# Patient Record
Sex: Male | Born: 1955
Health system: Southern US, Community
[De-identification: ages and names within clinical notes are randomized; demographics above are authoritative.]

## PROBLEM LIST (undated history)

## (undated) DIAGNOSIS — M1990 Unspecified osteoarthritis, unspecified site: Secondary | ICD-10-CM

## (undated) HISTORY — PX: CARPAL TUNNEL RELEASE: SHX101

## (undated) HISTORY — PX: GANGLION CYST EXCISION: SHX1691

---

## 2001-07-09 ENCOUNTER — Encounter: Payer: Self-pay | Admitting: Emergency Medicine

## 2001-07-09 ENCOUNTER — Emergency Department (HOSPITAL_COMMUNITY): Admission: EM | Admit: 2001-07-09 | Discharge: 2001-07-09 | Payer: Self-pay | Admitting: Emergency Medicine

## 2012-03-24 ENCOUNTER — Telehealth (HOSPITAL_COMMUNITY): Payer: Self-pay

## 2012-03-24 NOTE — Telephone Encounter (Signed)
Received a vm from Mercedes wanting to schedule pt for a lft arm angio/  She faxed the order.  It is in review with Dr. Oley Balm.

## 2012-03-25 ENCOUNTER — Telehealth (HOSPITAL_COMMUNITY): Payer: Self-pay

## 2012-03-25 ENCOUNTER — Other Ambulatory Visit (HOSPITAL_COMMUNITY): Payer: Self-pay | Admitting: Orthopedic Surgery

## 2012-03-25 DIAGNOSIS — Q279 Congenital malformation of peripheral vascular system, unspecified: Secondary | ICD-10-CM

## 2012-03-25 NOTE — Telephone Encounter (Signed)
I spoke with the pt's wife and Bonita Quin at [facility]

## 2012-03-27 ENCOUNTER — Other Ambulatory Visit: Payer: Self-pay | Admitting: Radiology

## 2012-03-31 ENCOUNTER — Other Ambulatory Visit: Payer: Self-pay | Admitting: Radiology

## 2012-04-02 ENCOUNTER — Encounter (HOSPITAL_COMMUNITY): Payer: Self-pay

## 2012-04-02 ENCOUNTER — Ambulatory Visit (HOSPITAL_COMMUNITY)
Admission: RE | Admit: 2012-04-02 | Discharge: 2012-04-02 | Disposition: A | Discharge status: Home / Self Care | Payer: Worker's Compensation | Source: Ambulatory Visit | Attending: Orthopedic Surgery | Admitting: Orthopedic Surgery

## 2012-04-02 ENCOUNTER — Other Ambulatory Visit (HOSPITAL_COMMUNITY): Payer: Self-pay | Admitting: Orthopedic Surgery

## 2012-04-02 DIAGNOSIS — M799 Soft tissue disorder, unspecified: Secondary | ICD-10-CM | POA: Insufficient documentation

## 2012-04-02 DIAGNOSIS — Q279 Congenital malformation of peripheral vascular system, unspecified: Secondary | ICD-10-CM

## 2012-04-02 LAB — BASIC METABOLIC PANEL
Calcium: 9 mg/dL (ref 8.4–10.5)
Chloride: 102 mEq/L (ref 96–112)
Creatinine, Ser: 0.75 mg/dL (ref 0.50–1.35)
GFR calc Af Amer: >90 mL/min (ref >90)
Sodium: 139 mEq/L (ref 135–145)

## 2012-04-02 LAB — DIFFERENTIAL
Basophils Absolute: 0 10*3/uL (ref 0.0–0.1)
Lymphocytes Relative: 32 % (ref 12–46)
Neutro Abs: 4 10*3/uL (ref 1.7–7.7)

## 2012-04-02 LAB — CBC
Platelets: 154 10*3/uL (ref 150–400)
RBC: 4.72 MIL/uL (ref 4.22–5.81)
RDW: 12.7 % (ref 11.5–15.5)
WBC: 7.3 10*3/uL (ref 4.0–10.5)

## 2012-04-02 LAB — PROTIME-INR
INR: 0.93 (ref 0.00–1.49)
Prothrombin Time: 12.7 seconds (ref 11.6–15.2)

## 2012-04-02 LAB — APTT: aPTT: 26 seconds (ref 24–37)

## 2012-04-02 MED ORDER — HYDROCODONE-ACETAMINOPHEN 5-325 MG PO TABS: 1.0000 | ORAL_TABLET | ORAL | Status: DC | PRN

## 2012-04-02 MED ORDER — MIDAZOLAM HCL 5 MG/5ML IJ SOLN
INTRAMUSCULAR | Status: AC | PRN
  Administered 2012-04-02: 2 mg via INTRAVENOUS
  Administered 2012-04-02: 0.5 mg via INTRAVENOUS
  Administered 2012-04-02: 1 mg via INTRAVENOUS

## 2012-04-02 MED ORDER — MIDAZOLAM HCL 2 MG/2ML IJ SOLN
INTRAMUSCULAR | Status: AC
  Filled 2012-04-02: qty 6

## 2012-04-02 MED ORDER — NITROGLYCERIN 1 MG/10 ML FOR IR/CATH LAB
INTRA_ARTERIAL | Status: AC
  Filled 2012-04-02: qty 10

## 2012-04-02 MED ORDER — FENTANYL CITRATE 0.05 MG/ML IJ SOLN
INTRAMUSCULAR | Status: AC
  Filled 2012-04-02: qty 4

## 2012-04-02 MED ORDER — SODIUM CHLORIDE 0.9 % IV SOLN
Freq: Once | INTRAVENOUS | Status: AC
  Administered 2012-04-02: 1000 mL via INTRAVENOUS

## 2012-04-02 MED ORDER — IODIXANOL 320 MG/ML IV SOLN
300.0000 mL | Freq: Once | INTRAVENOUS | Status: AC | PRN
  Administered 2012-04-02: 110 mL via INTRA_ARTERIAL

## 2012-04-02 MED ORDER — FENTANYL CITRATE 0.05 MG/ML IJ SOLN
INTRAMUSCULAR | Status: AC | PRN
  Administered 2012-04-02 (×2): 25 ug via INTRAVENOUS
  Administered 2012-04-02: 50 ug via INTRAVENOUS

## 2012-04-02 NOTE — H&P (Signed)
Shawn [name] is an 56 y.o. male.   Chief Complaint: Rt lateral epicondylitis x weeks; recent MRI shows mass effect at rt elbow- arteriovenous malformation. Scheduled now for arteriogram and possible intervention HPI: epicondylitis  No past medical history on file.  No past surgical history on file.  No family history on file. Social History:  reports that he has never smoked. He does not have any smokeless tobacco history on file. His alcohol and drug histories not on file.  Allergies: No Known Allergies   (Not in a hospital admission)  Results for orders placed during the hospital encounter of 04/02/12 (from the past 48 hour(s))  CBC     Status: Normal   Collection Time   04/02/12  7:15 AM      Component Value Range Comment   WBC 7.3  4.0 - 10.5 (K/uL)    RBC 4.72  4.22 - 5.81 (MIL/uL)    Hemoglobin 15.3  13.0 - 17.0 (g/dL)    HCT 16.1  09.6 - 04.5 (%)    MCV 90.3  78.0 - 100.0 (fL)    MCH 32.4  26.0 - 34.0 (pg)    MCHC 35.9  30.0 - 36.0 (g/dL)    RDW 40.9  81.1 - 91.4 (%)    Platelets 154  150 - 400 (K/uL)   DIFFERENTIAL     Status: Normal   Collection Time   04/02/12  7:15 AM      Component Value Range Comment   Neutrophils Relative 56  43 - 77 (%)    Neutro Abs 4.0  1.7 - 7.7 (K/uL)    Lymphocytes Relative 32  12 - 46 (%)    Lymphs Abs 2.3  0.7 - 4.0 (K/uL)    Monocytes Relative 9  3 - 12 (%)    Monocytes Absolute 0.6  0.1 - 1.0 (K/uL)    Eosinophils Relative 4  0 - 5 (%)    Eosinophils Absolute 0.3  0.0 - 0.7 (K/uL)    Basophils Relative 0  0 - 1 (%)    Basophils Absolute 0.0  0.0 - 0.1 (K/uL)    No results found.  Review of Systems  Constitutional: Negative for fever.  Eyes: Negative for blurred vision.  Respiratory: Negative for cough.   Cardiovascular: Negative for chest pain.  Gastrointestinal: Negative for nausea and vomiting.  Musculoskeletal: Negative for joint pain.  Neurological: Negative for dizziness and headaches.    Blood pressure 136/82,  pulse 79, temperature 97 F (36.1 C), temperature source Oral, resp. rate 20, height 5\' 6"  (1.676 m), weight 214 lb (97.07 kg), SpO2 97.00%. Physical Exam  Constitutional: He is oriented to person, place, and time. He appears well-developed and well-nourished.  Cardiovascular: Normal rate, regular rhythm and normal heart sounds.   No murmur heard. Respiratory: Effort normal and breath sounds normal. He has no wheezes.  GI: Soft. Bowel sounds are normal. There is no tenderness.  Musculoskeletal: Normal range of motion.       Palpable mass at Rt elbow; non pulsatile  Neurological: He is alert and oriented to person, place, and time.  Skin: Skin is warm and dry.  Psychiatric: He has a normal mood and affect. His behavior is normal. Judgment and thought content normal.     Assessment/Plan Rt lateral epicondylitis- mass like area at elbow; recent MRI shows possible AVM Now scheduled for arteriogram and possible intervention Pt aware of procedure benefits and risks and agreeable to proceed. Consent signed.  Matson Welch A  04/02/2012, 7:39 AM

## 2012-04-02 NOTE — Procedures (Signed)
Procedure:  Right upper extremity arteriography Findings:  Full report to be dictated.  Two areas of enhancing abnormality in proximal forearm.  No true AVM.   Closure w/ Cordis ExoSeal.  4 hour recovery.

## 2012-04-02 NOTE — H&P (Signed)
Agree 

## 2012-04-02 NOTE — Progress Notes (Signed)
Pt walked in hallway without difficulty.  Right groin dressing CDI. 

## 2012-04-02 NOTE — Discharge Instructions (Signed)

## 2012-04-02 NOTE — ED Notes (Signed)
R AIR SAT 97%

## 2012-04-02 NOTE — ED Notes (Signed)
Pedal pulses strong, tibial  pulse 2t

## 2012-04-22 ENCOUNTER — Other Ambulatory Visit: Payer: Self-pay | Admitting: Orthopedic Surgery

## 2012-05-04 ENCOUNTER — Encounter (HOSPITAL_COMMUNITY): Payer: Self-pay | Admitting: Pharmacy Technician

## 2012-05-11 ENCOUNTER — Encounter (HOSPITAL_COMMUNITY)
Admission: RE | Admit: 2012-05-11 | Discharge: 2012-05-11 | Disposition: A | Discharge status: Home / Self Care | Payer: Worker's Compensation | Source: Ambulatory Visit | Attending: Orthopedic Surgery | Admitting: Orthopedic Surgery

## 2012-05-11 ENCOUNTER — Encounter (HOSPITAL_COMMUNITY): Payer: Self-pay

## 2012-05-11 HISTORY — DX: Unspecified osteoarthritis, unspecified site: M19.90

## 2012-05-11 LAB — CBC
HCT: 42.6 % (ref 39.0–52.0)
Hemoglobin: 15.2 g/dL (ref 13.0–17.0)
MCH: 32.7 pg (ref 26.0–34.0)
MCHC: 35.7 g/dL (ref 30.0–36.0)
MCV: 91.6 fL (ref 78.0–100.0)
RBC: 4.65 MIL/uL (ref 4.22–5.81)

## 2012-05-11 NOTE — Pre-Procedure Instructions (Signed)
20 Shawn [name]  05/11/2012   Your procedure is scheduled on:  Fri, July 19 @ 7:30 AM  Report to [facility] at 5:30 AM.  Call this number if you have problems the morning of surgery: [phone]   Remember:   Do not eat food:After Midnight.       Do not wear jewelry  Do not wear lotions, powders, or cologne  Men may shave face and neck.  Do not bring valuables to the hospital.  Contacts, dentures or bridgework may not be worn into surgery.  Leave suitcase in the car. After surgery it may be brought to your room.  For patients admitted to the hospital, checkout time is 11:00 AM the day of discharge.   Patients discharged the day of surgery will not be allowed to drive home.    Special Instructions: CHG Shower Use Special Wash: 1/2 bottle night before surgery and 1/2 bottle morning of surgery.   Please read over the following fact sheets that you were given: Pain Booklet, Coughing and Deep Breathing, MRSA Information and Surgical Site Infection Prevention

## 2012-05-14 MED ORDER — CHLORHEXIDINE GLUCONATE 4 % EX LIQD: 60.0000 mL | Freq: Once | CUTANEOUS | Status: DC

## 2012-05-14 MED ORDER — CEFAZOLIN SODIUM-DEXTROSE 2-3 GM-% IV SOLR
2.0000 g | INTRAVENOUS | Status: AC
  Administered 2012-05-15: 2 g via INTRAVENOUS
  Filled 2012-05-14: qty 50

## 2012-05-14 MED ORDER — SODIUM CHLORIDE 0.45 % IV SOLN: INTRAVENOUS | Status: DC

## 2012-05-15 ENCOUNTER — Encounter (HOSPITAL_COMMUNITY): Payer: Self-pay | Admitting: Anesthesiology

## 2012-05-15 ENCOUNTER — Encounter (HOSPITAL_COMMUNITY)
Admission: RE | Disposition: A | Discharge status: Home / Self Care | Payer: Self-pay | Source: Ambulatory Visit | Attending: Orthopedic Surgery

## 2012-05-15 ENCOUNTER — Inpatient Hospital Stay (HOSPITAL_COMMUNITY)
Admission: RE | Admit: 2012-05-15 | Discharge: 2012-05-16 | DRG: 494 | Disposition: A | Discharge status: Home / Self Care | Payer: Worker's Compensation | Source: Ambulatory Visit | Attending: Orthopedic Surgery | Admitting: Orthopedic Surgery

## 2012-05-15 ENCOUNTER — Ambulatory Visit (HOSPITAL_COMMUNITY): Payer: Worker's Compensation | Admitting: Anesthesiology

## 2012-05-15 ENCOUNTER — Encounter (HOSPITAL_COMMUNITY): Payer: Self-pay | Admitting: General Practice

## 2012-05-15 DIAGNOSIS — R2230 Localized swelling, mass and lump, unspecified upper limb: Secondary | ICD-10-CM

## 2012-05-15 DIAGNOSIS — M7710 Lateral epicondylitis, unspecified elbow: Principal | ICD-10-CM | POA: Diagnosis present

## 2012-05-15 DIAGNOSIS — D2110 Benign neoplasm of connective and other soft tissue of unspecified upper limb, including shoulder: Secondary | ICD-10-CM | POA: Diagnosis present

## 2012-05-15 DIAGNOSIS — Z01818 Encounter for other preprocedural examination: Secondary | ICD-10-CM

## 2012-05-15 DIAGNOSIS — M6580 Other synovitis and tenosynovitis, unspecified site: Secondary | ICD-10-CM | POA: Diagnosis present

## 2012-05-15 DIAGNOSIS — M129 Arthropathy, unspecified: Secondary | ICD-10-CM | POA: Diagnosis present

## 2012-05-15 DIAGNOSIS — Z01812 Encounter for preprocedural laboratory examination: Secondary | ICD-10-CM

## 2012-05-15 DIAGNOSIS — M7711 Lateral epicondylitis, right elbow: Secondary | ICD-10-CM

## 2012-05-15 HISTORY — PX: ULNAR NERVE TRANSPOSITION: SHX2595

## 2012-05-15 HISTORY — PX: SYNOVECTOMY: SHX5180

## 2012-05-15 HISTORY — PX: MASS EXCISION: SHX2000

## 2012-05-15 SURGERY — EXCISION MASS
Anesthesia: General | Site: Elbow | Laterality: Right | Wound class: Clean

## 2012-05-15 MED ORDER — METHOCARBAMOL 500 MG PO TABS
500.0000 mg | ORAL_TABLET | Freq: Four times a day (QID) | ORAL | Status: DC | PRN
  Filled 2012-05-15: qty 1

## 2012-05-15 MED ORDER — FENTANYL CITRATE 0.05 MG/ML IJ SOLN
INTRAMUSCULAR | Status: DC | PRN
  Administered 2012-05-15 (×3): 25 ug via INTRAVENOUS
  Administered 2012-05-15: 100 ug via INTRAVENOUS

## 2012-05-15 MED ORDER — ONDANSETRON HCL 4 MG PO TABS: 4.0000 mg | ORAL_TABLET | Freq: Four times a day (QID) | ORAL | Status: DC | PRN

## 2012-05-15 MED ORDER — PROMETHAZINE HCL 25 MG RE SUPP: 12.5000 mg | Freq: Four times a day (QID) | RECTAL | Status: DC | PRN

## 2012-05-15 MED ORDER — 0.9 % SODIUM CHLORIDE (POUR BTL) OPTIME
TOPICAL | Status: DC | PRN
  Administered 2012-05-15 (×3): 1000 mL

## 2012-05-15 MED ORDER — ONDANSETRON HCL 4 MG/2ML IJ SOLN: 4.0000 mg | Freq: Four times a day (QID) | INTRAMUSCULAR | Status: DC | PRN

## 2012-05-15 MED ORDER — METHOCARBAMOL 500 MG PO TABS: 500.0000 mg | ORAL_TABLET | Freq: Four times a day (QID) | ORAL | Status: DC | PRN

## 2012-05-15 MED ORDER — ALPRAZOLAM 0.5 MG PO TABS: 0.5000 mg | ORAL_TABLET | Freq: Four times a day (QID) | ORAL | Status: DC | PRN

## 2012-05-15 MED ORDER — HYDROMORPHONE HCL PF 1 MG/ML IJ SOLN
0.2500 mg | INTRAMUSCULAR | Status: DC | PRN
  Administered 2012-05-15 (×4): 0.5 mg via INTRAVENOUS

## 2012-05-15 MED ORDER — LIDOCAINE HCL (CARDIAC) 20 MG/ML IV SOLN
INTRAVENOUS | Status: DC | PRN
  Administered 2012-05-15: 60 mg via INTRAVENOUS

## 2012-05-15 MED ORDER — ONDANSETRON HCL 4 MG/2ML IJ SOLN
INTRAMUSCULAR | Status: DC | PRN
  Administered 2012-05-15: 4 mg via INTRAVENOUS

## 2012-05-15 MED ORDER — HYDROMORPHONE HCL PF 1 MG/ML IJ SOLN
INTRAMUSCULAR | Status: AC
  Administered 2012-05-15: 0.5 mg via INTRAVENOUS
  Filled 2012-05-15: qty 1

## 2012-05-15 MED ORDER — DOCUSATE SODIUM 100 MG PO CAPS
100.0000 mg | ORAL_CAPSULE | Freq: Two times a day (BID) | ORAL | Status: DC
  Administered 2012-05-15 – 2012-05-16 (×2): 100 mg via ORAL
  Filled 2012-05-15 (×2): qty 1

## 2012-05-15 MED ORDER — PROPOFOL 10 MG/ML IV EMUL
INTRAVENOUS | Status: DC | PRN
  Administered 2012-05-15: 200 mg via INTRAVENOUS

## 2012-05-15 MED ORDER — LACTATED RINGERS IV SOLN
INTRAVENOUS | Status: DC
  Administered 2012-05-15: 1000 mL via INTRAVENOUS
  Administered 2012-05-16: 08:00:00 via INTRAVENOUS

## 2012-05-15 MED ORDER — FAMOTIDINE 20 MG PO TABS: 20.0000 mg | ORAL_TABLET | Freq: Two times a day (BID) | ORAL | Status: DC | PRN

## 2012-05-15 MED ORDER — ONDANSETRON HCL 4 MG/2ML IJ SOLN: 4.0000 mg | Freq: Once | INTRAMUSCULAR | Status: DC | PRN

## 2012-05-15 MED ORDER — FAMOTIDINE 20 MG PO TABS
20.0000 mg | ORAL_TABLET | Freq: Two times a day (BID) | ORAL | Status: DC | PRN
  Filled 2012-05-15: qty 1

## 2012-05-15 MED ORDER — METHOCARBAMOL 100 MG/ML IJ SOLN
500.0000 mg | Freq: Four times a day (QID) | INTRAVENOUS | Status: DC | PRN
  Administered 2012-05-15: 500 mg via INTRAVENOUS
  Filled 2012-05-15 (×3): qty 5

## 2012-05-15 MED ORDER — BUPIVACAINE HCL (PF) 0.25 % IJ SOLN
INTRAMUSCULAR | Status: AC
  Filled 2012-05-15: qty 30

## 2012-05-15 MED ORDER — OXYCODONE HCL 5 MG PO TABS: 5.0000 mg | ORAL_TABLET | ORAL | Status: DC | PRN

## 2012-05-15 MED ORDER — BUPIVACAINE HCL (PF) 0.25 % IJ SOLN
INTRAMUSCULAR | Status: DC | PRN
  Administered 2012-05-15: 10 mL

## 2012-05-15 MED ORDER — CEFAZOLIN SODIUM 1-5 GM-% IV SOLN
1.0000 g | INTRAVENOUS | Status: AC
  Administered 2012-05-15: 1 g via INTRAVENOUS
  Filled 2012-05-15: qty 50

## 2012-05-15 MED ORDER — VITAMIN C 500 MG PO TABS
1000.0000 mg | ORAL_TABLET | Freq: Every day | ORAL | Status: DC
  Administered 2012-05-16: 1000 mg via ORAL
  Filled 2012-05-15 (×2): qty 2

## 2012-05-15 MED ORDER — DOCUSATE SODIUM 100 MG PO CAPS: 100.0000 mg | ORAL_CAPSULE | Freq: Two times a day (BID) | ORAL | Status: DC

## 2012-05-15 MED ORDER — LACTATED RINGERS IV SOLN
INTRAVENOUS | Status: DC | PRN
  Administered 2012-05-15 (×2): via INTRAVENOUS

## 2012-05-15 MED ORDER — MORPHINE SULFATE 2 MG/ML IJ SOLN
1.0000 mg | INTRAMUSCULAR | Status: DC | PRN
  Administered 2012-05-15: 1 mg via INTRAVENOUS
  Filled 2012-05-15 (×2): qty 1

## 2012-05-15 MED ORDER — CHLORHEXIDINE GLUCONATE CLOTH 2 % EX PADS
6.0000 | MEDICATED_PAD | Freq: Every day | CUTANEOUS | Status: DC
  Administered 2012-05-15 – 2012-05-16 (×2): 6 via TOPICAL

## 2012-05-15 MED ORDER — CEFAZOLIN SODIUM 1-5 GM-% IV SOLN
1.0000 g | Freq: Three times a day (TID) | INTRAVENOUS | Status: DC
  Administered 2012-05-15 – 2012-05-16 (×2): 1 g via INTRAVENOUS
  Filled 2012-05-15 (×5): qty 50

## 2012-05-15 MED ORDER — MORPHINE SULFATE 2 MG/ML IJ SOLN
1.0000 mg | INTRAMUSCULAR | Status: DC | PRN
  Administered 2012-05-15: 1 mg via INTRAVENOUS

## 2012-05-15 MED ORDER — METHOCARBAMOL 100 MG/ML IJ SOLN: 500.0000 mg | Freq: Four times a day (QID) | INTRAVENOUS | Status: DC | PRN

## 2012-05-15 MED ORDER — MUPIROCIN 2 % EX OINT
1.0000 "application " | TOPICAL_OINTMENT | Freq: Two times a day (BID) | CUTANEOUS | Status: DC
  Administered 2012-05-15 – 2012-05-16 (×3): 1 via NASAL
  Filled 2012-05-15: qty 22

## 2012-05-15 MED ORDER — VITAMIN C 500 MG PO TABS: 1000.0000 mg | ORAL_TABLET | Freq: Every day | ORAL | Status: DC

## 2012-05-15 MED ORDER — OXYCODONE HCL 5 MG PO TABS
5.0000 mg | ORAL_TABLET | ORAL | Status: DC | PRN
  Administered 2012-05-15 – 2012-05-16 (×7): 10 mg via ORAL
  Filled 2012-05-15 (×7): qty 2

## 2012-05-15 SURGICAL SUPPLY — 66 items
BANDAGE ELASTIC 3 VELCRO ST LF (GAUZE/BANDAGES/DRESSINGS) ×5 IMPLANT
BANDAGE ELASTIC 4 VELCRO ST LF (GAUZE/BANDAGES/DRESSINGS) ×5 IMPLANT
BANDAGE GAUZE ELAST BULKY 4 IN (GAUZE/BANDAGES/DRESSINGS) ×5 IMPLANT
BNDG CMPR 9X4 STRL LF SNTH (GAUZE/BANDAGES/DRESSINGS)
BNDG COHESIVE 4X5 TAN STRL (GAUZE/BANDAGES/DRESSINGS) ×5 IMPLANT
BNDG ESMARK 4X9 LF (GAUZE/BANDAGES/DRESSINGS) ×3 IMPLANT
CLOTH BEACON ORANGE TIMEOUT ST (SAFETY) ×5 IMPLANT
CONT SPEC 4OZ CLIKSEAL STRL BL (MISCELLANEOUS) ×2 IMPLANT
CORDS BIPOLAR (ELECTRODE) ×5 IMPLANT
COVER MAYO STAND STRL (DRAPES) ×3 IMPLANT
COVER SURGICAL LIGHT HANDLE (MISCELLANEOUS) ×5 IMPLANT
CUFF TOURNIQUET SINGLE 18IN (TOURNIQUET CUFF) ×5 IMPLANT
CUFF TOURNIQUET SINGLE 24IN (TOURNIQUET CUFF) IMPLANT
DRAPE EXTREMITY T 121X128X90 (DRAPE) ×2 IMPLANT
DRAPE INCISE IOBAN 66X45 STRL (DRAPES) ×3 IMPLANT
DRAPE OEC MINIVIEW 54X84 (DRAPES) IMPLANT
DRAPE ORTHO SPLIT 77X108 STRL (DRAPES) ×10
DRAPE SURG 17X23 STRL (DRAPES) ×2 IMPLANT
DRAPE SURG ORHT 6 SPLT 77X108 (DRAPES) ×2 IMPLANT
DRSG ADAPTIC 3X8 NADH LF (GAUZE/BANDAGES/DRESSINGS) ×2 IMPLANT
GAUZE XEROFORM 1X8 LF (GAUZE/BANDAGES/DRESSINGS) ×3 IMPLANT
GAUZE XEROFORM 5X9 LF (GAUZE/BANDAGES/DRESSINGS) ×2 IMPLANT
GLOVE BIOGEL M STRL SZ7.5 (GLOVE) ×5 IMPLANT
GLOVE SS BIOGEL STRL SZ 8 (GLOVE) ×4 IMPLANT
GLOVE SUPERSENSE BIOGEL SZ 8 (GLOVE) ×1
GOWN PREVENTION PLUS XLARGE (GOWN DISPOSABLE) ×5 IMPLANT
GOWN STRL NON-REIN LRG LVL3 (GOWN DISPOSABLE) ×10 IMPLANT
GOWN STRL REIN XL XLG (GOWN DISPOSABLE) ×10 IMPLANT
KIT BASIN OR (CUSTOM PROCEDURE TRAY) ×5 IMPLANT
KIT ROOM TURNOVER OR (KITS) ×5 IMPLANT
LOOP VESSEL MAXI BLUE (MISCELLANEOUS) IMPLANT
MANIFOLD NEPTUNE II (INSTRUMENTS) ×5 IMPLANT
NDL HYPO 25GX1X1/2 BEV (NEEDLE) IMPLANT
NEEDLE HYPO 25GX1X1/2 BEV (NEEDLE) ×5 IMPLANT
NS IRRIG 1000ML POUR BTL (IV SOLUTION) ×5 IMPLANT
PACK ORTHO EXTREMITY (CUSTOM PROCEDURE TRAY) ×3 IMPLANT
PAD ARMBOARD 7.5X6 YLW CONV (MISCELLANEOUS) ×10 IMPLANT
PAD CAST 4YDX4 CTTN HI CHSV (CAST SUPPLIES) ×7 IMPLANT
PADDING CAST COTTON 4X4 STRL (CAST SUPPLIES) ×5
SOLUTION BETADINE 4OZ (MISCELLANEOUS) ×5 IMPLANT
SPECIMEN JAR SMALL (MISCELLANEOUS) ×3 IMPLANT
SPLINT FIBERGLASS 4X30 (CAST SUPPLIES) ×2 IMPLANT
SPLINT PLASTER CAST XFAST 5X30 (CAST SUPPLIES) ×1 IMPLANT
SPLINT PLASTER XFAST SET 5X30 (CAST SUPPLIES) ×1
SPONGE GAUZE 4X4 12PLY (GAUZE/BANDAGES/DRESSINGS) ×5 IMPLANT
SPONGE LAP 18X18 X RAY DECT (DISPOSABLE) ×2 IMPLANT
SPONGE LAP 4X18 X RAY DECT (DISPOSABLE) ×4 IMPLANT
SPONGE SCRUB IODOPHOR (GAUZE/BANDAGES/DRESSINGS) ×5 IMPLANT
STRIP CLOSURE SKIN 1/2X4 (GAUZE/BANDAGES/DRESSINGS) ×2 IMPLANT
SUCTION FRAZIER TIP 10 FR DISP (SUCTIONS) IMPLANT
SUT FIBERWIRE 2-0 18 17.9 3/8 (SUTURE) ×10
SUT MERSILENE 4 0 P 3 (SUTURE) IMPLANT
SUT PROLENE 3 0 PS 2 (SUTURE) ×6 IMPLANT
SUT PROLENE 4 0 PS 2 18 (SUTURE) IMPLANT
SUT PROLENE 5 0 PS 2 (SUTURE) ×2 IMPLANT
SUT PROLENE 7 0 P 1 (SUTURE) ×2 IMPLANT
SUT VIC AB 2-0 CT1 27 (SUTURE)
SUT VIC AB 2-0 CT1 TAPERPNT 27 (SUTURE) IMPLANT
SUT VICRYL 4-0 PS2 18IN ABS (SUTURE) ×4 IMPLANT
SUTURE FIBERWR 2-0 18 17.9 3/8 (SUTURE) ×2 IMPLANT
SYR CONTROL 10ML LL (SYRINGE) ×2 IMPLANT
TOWEL OR 17X24 6PK STRL BLUE (TOWEL DISPOSABLE) ×5 IMPLANT
TOWEL OR 17X26 10 PK STRL BLUE (TOWEL DISPOSABLE) ×10 IMPLANT
TUBE CONNECTING 12X1/4 (SUCTIONS) ×2 IMPLANT
UNDERPAD 30X30 INCONTINENT (UNDERPADS AND DIAPERS) ×5 IMPLANT
WATER STERILE IRR 1000ML POUR (IV SOLUTION) ×3 IMPLANT

## 2012-05-15 NOTE — Preoperative (Signed)
Beta Blockers   Reason not to administer Beta Blockers:Not Applicable. No home beta blockers 

## 2012-05-15 NOTE — Anesthesia Procedure Notes (Signed)
Procedure Name: LMA Insertion Date/Time: 05/15/2012 7:32 AM Performed by: Garen Lah Pre-anesthesia Checklist: Patient identified, Timeout performed, Emergency Drugs available, Suction available and Patient being monitored Patient Re-evaluated:Patient Re-evaluated prior to inductionOxygen Delivery Method: Circle system utilized Preoxygenation: Pre-oxygenation with 100% oxygen Intubation Type: IV induction LMA: LMA inserted LMA Size: 4.0 Number of attempts: 1 Placement Confirmation: positive ETCO2 and breath sounds checked- equal and bilateral Tube secured with: Tape Dental Injury: Teeth and Oropharynx as per pre-operative assessment

## 2012-05-15 NOTE — Anesthesia Preprocedure Evaluation (Addendum)
Anesthesia Evaluation  Patient identified by MRN, date of birth, ID band Patient awake    Reviewed: Allergy & Precautions, H&P , NPO status , Patient's Chart, lab work & pertinent test results, reviewed documented beta blocker date and time   Airway Mallampati: I TM Distance: >3 FB Neck ROM: full    Dental  (+) Teeth Intact and Dental Advisory Given   Pulmonary          Cardiovascular Rhythm:regular Rate:Normal     Neuro/Psych    GI/Hepatic   Endo/Other    Renal/GU      Musculoskeletal   Abdominal   Peds  Hematology   Anesthesia Other Findings   Reproductive/Obstetrics                          Anesthesia Physical Anesthesia Plan  ASA: I  Anesthesia Plan: General   Post-op Pain Management:    Induction: Intravenous  Airway Management Planned: LMA  Additional Equipment:   Intra-op Plan:   Post-operative Plan: Extubation in OR  Informed Consent: I have reviewed the patients History and Physical, chart, labs and discussed the procedure including the risks, benefits and alternatives for the proposed anesthesia with the patient or authorized representative who has indicated his/her understanding and acceptance.   Dental advisory given  Plan Discussed with: CRNA, Anesthesiologist and Surgeon  Anesthesia Plan Comments:        Anesthesia Quick Evaluation

## 2012-05-15 NOTE — Op Note (Signed)
See Dict # [id number] Amanda Pea MD

## 2012-05-15 NOTE — Anesthesia Postprocedure Evaluation (Signed)
  Anesthesia Post-op Note  Patient: Shawn [name]  Procedure(s) Performed: Procedure(s) (LRB): EXCISION MASS (Right) ULNAR NERVE DECOMPRESSION/TRANSPOSITION (Right) SYNOVECTOMY (Right)  Patient Location: PACU  Anesthesia Type: General  Level of Consciousness: awake, alert  and oriented  Airway and Oxygen Therapy: Patient Spontanous Breathing and Patient connected to nasal cannula oxygen  Post-op Pain: mild  Post-op Assessment: Post-op Vital signs reviewed, Patient's Cardiovascular Status Stable, Respiratory Function Stable, Patent Airway, No signs of Nausea or vomiting and Pain level controlled  Post-op Vital Signs: Reviewed and stable  Complications: No apparent anesthesia complications

## 2012-05-15 NOTE — Transfer of Care (Signed)
Immediate Anesthesia Transfer of Care Note  Patient: Shawn [name]  Procedure(s) Performed: Procedure(s) (LRB): EXCISION MASS (Right) ULNAR NERVE DECOMPRESSION/TRANSPOSITION (Right) SYNOVECTOMY (Right)  Patient Location: PACU  Anesthesia Type: General  Level of Consciousness: awake, alert  and oriented  Airway & Oxygen Therapy: Patient Spontanous Breathing and Patient connected to nasal cannula oxygen  Post-op Assessment: Report given to PACU RN and Post -op Vital signs reviewed and stable  Post vital signs: Reviewed and stable  Complications: No apparent anesthesia complications

## 2012-05-15 NOTE — H&P (Signed)
Shawn [name] is an 56 y.o. male.   Chief Complaint: Right arm pain HPI: Patient presents for right forearm mass removal as well as right lateral epicondylitis repair reconstruction is necessary.  Patient complains of pain about the lateral elbow which is been treated with conservative measures to no avail including injection pharmacologic therapy and other conservative measures. He understands the proposed op intervention. He is very large mass in his forearm which we are going to remove. Had studied this with angiography to rule out a large aneurysm. This appears to be either a feeding AV malformation or a cystic-type structure.  He denies neck back chest or abdominal pain .Marland KitchenPatient presents for evaluation and treatment of the of their upper extremity predicament. The patient denies neck back chest or of abdominal pain. The patient notes that they have no lower extremity problems. The patient from primarily complains of the upper extremity pain noted. Past Medical History  Diagnosis Date  . Arthritis     Past Surgical History  Procedure Date  . Carpal tunnel release     bil  . Ganglion cyst excision     No family history on file. Social History:  reports that he quit smoking about 22 years ago. He does not have any smokeless tobacco history on file. He reports that he does not drink alcohol or use illicit drugs.  Allergies: No Known Allergies  Medications Prior to Admission  Medication Sig Dispense Refill  . cholecalciferol (VITAMIN D) 1000 UNITS tablet Take 1,000 Units by mouth daily.      . Multiple Vitamin (MULITIVITAMIN WITH MINERALS) TABS Take 1 tablet by mouth daily.      . Saw Palmetto, Serenoa repens, (SAW PALMETTO PO) Take 1 tablet by mouth daily.      . traMADol (ULTRAM) 50 MG tablet Take 50 mg by mouth every 8 (eight) hours as needed. For pain        No results found for this or any previous visit (from the past 48 hour(s)). No results found.  Review of Systems    Constitutional: Negative.   HENT: Negative.   Eyes: Negative.   Respiratory: Negative.   Cardiovascular: Negative.   Gastrointestinal: Negative.   Genitourinary: Negative.   Skin: Negative.   Neurological: Negative.     Blood pressure 130/88, pulse 75, temperature 98 F (36.7 C), temperature source Oral, resp. rate 18, SpO2 97.00%. Physical Exam patient has a mass in his left arm palpable on the medial aspect. He is neurovascularly intact. Patient has chronic lateral mellitus about the right elbow. His painful and has failed conservative management. He has a normal pulse he has intact radial median and ulnar nerve function. Marland Kitchen.The patient is alert and oriented in no acute distress the patient complains of pain in the affected upper extremity. The patient is noted to have a normal HEENT exam. Lung fields show equal chest expansion and no shortness of breath abdomen exam is nontender without distention. Lower extremity examination does not show any fracture dislocation or blood clot symptoms. Pelvis is stable neck and back are stable and nontender  Assessment/Plan .Marland KitchenWe are planning surgery for your upper extremity. The risk and benefits of surgery include risk of bleeding infection anesthesia damage to normal structures and failure of the surgery to accomplish its intended goals of relieving symptoms and restoring function with this in mind we'll going to proceed. I have specifically discussed with the patient the pre-and postoperative regime and the does and don'ts and risk and benefits  in great detail. Risk and benefits of surgery also include risk of dystrophy chronic nerve pain failure of the healing process to go onto completion and other inherent risks of surgery The relavent the pathophysiology of the disease/injury process, as well as the alternatives for treatment and postoperative course of action has been discussed in great detail with the patient who desires to proceed.  We will do  everything in our power to help you (the patient) restore function to the upper extremity. Is a pleasure to see this patient today.   Karen Chafe 05/15/2012, 7:16 AM

## 2012-05-16 MED ORDER — METHOCARBAMOL 500 MG PO TABS: 500.0000 mg | ORAL_TABLET | Freq: Four times a day (QID) | ORAL | Status: AC | PRN

## 2012-05-16 MED ORDER — OXYCODONE HCL 5 MG PO TABS: 5.0000 mg | ORAL_TABLET | ORAL | Status: AC | PRN

## 2012-05-16 NOTE — Discharge Summary (Signed)
.. Physician Discharge Summary  Patient ID: Shawn [name] MRN: [id number] DOB/AGE: 1955-12-19 56 y.o.  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 The patient has a long history of chronic lateral epicondylitis, he is tried for conservative route treatment regime and persistent have significant pain and interference of daily activities. In addition the patient was noted to have findings on his MRI with a large forearm mass greater than 15 cm in nature and course of his workup for lateral epicondylitis. Given the large nature of the mass and pain associated with this, the decision was made to proceed with surgical excision as well as a concomitant ECRB debridement  The patient was admitted on 05/15/2012 and underwent the above surgical procedure without complications, please see operative report for full details. The patient was admitted to the orthopedic unit for observation, elevation of the upper extremity, IV pain medication and IV antibiotic administration he did very well he did have some difficulties with pain postoperative day #1 that the following day his pain was improved and was tolerable with the pain medications. The patient was tolerating a normal diet he was voiding without difficulty, and was noted to have flatus. Given his overall improved state decision was made to discharge him home.  Consults: Pathology   Treatments: Right elbow ECRB debridement, right forearm mass removal greater than 15 cm in nature,  please see operative report for full details  Discharge Exam: Blood pressure 133/68, pulse 98, temperature 98.9 F (37.2 C), temperature source Oral, resp. rate 20, height 5\' 6"  (1.676 m), weight 95.618 kg (210 lb 12.8 oz), SpO2 95.00%. Marland Kitchen.Patient presents for evaluation and treatment of the of their upper extremity predicament. The patient denies neck back chest or of abdominal pain. The patient notes that they have no lower extremity problems. The patient from primarily complains of the upper extremity pain noted. evaluation of the right upper extremity shows that his splint is clean and intact, he has excellent digital range of motion, no signs of infection are present. The drain is removed without difficulties. He has some mild numbness along the ulnar nerve distribution in the small and ring finger not unexpected.  Disposition: Final discharge disposition not confirmed  Discharge Orders    Future Orders Please Complete By Expires   Diet - low sodium heart healthy      Call MD / Call 911      Comments:   If you experience chest pain or shortness of breath, CALL 911 and be transported to the hospital emergency room.  If you develope a fever above 101 F, pus (white drainage) or increased drainage or redness at the wound, or calf pain, call your surgeon's office.   Constipation Prevention      Comments:   Drink plenty of fluids.  Prune juice may be helpful.  You may use a stool softener, such as Colace (over the counter) 100 mg twice a day.  Use MiraLax (over the counter) for constipation as needed.   Increase activity slowly as tolerated      OT splint / sling  05/16/13     Medication List  As of 05/16/2012  1:24 PM   TAKE these medications         cholecalciferol 1000 UNITS tablet   Commonly known as: VITAMIN D   Take 1,000 Units by mouth daily.      methocarbamol 500 MG tablet   Commonly known as: ROBAXIN   Take 1 tablet (500 mg total) by mouth every 6 (six) hours as needed.       multivitamin with minerals Tabs   Take 1 tablet by mouth daily.      oxyCODONE 5 MG immediate release tablet   Commonly known as: Oxy IR/ROXICODONE   Take 1 tablet (5 mg total) by mouth every 3 (three) hours as needed.      SAW PALMETTO PO   Take 1 tablet by mouth daily.      traMADol 50 MG tablet   Commonly known as: ULTRAM   Take 50 mg by mouth every 8 (eight) hours as needed. For pain           Follow-up Information    Follow up with Karen Chafe, MD. Schedule an appointment as soon as possible for a visit in 12 days. (please schedule appointment with therapy  to immediately follow M.D. appt.)    Contact information:   [address] [place] [address] [phone]          Signed: Sheran Lawless 05/16/2012, 1:24 PM

## 2012-05-16 NOTE — Progress Notes (Signed)
Occupational Therapy Evaluation Patient Details Name: Shawn [name] MRN: [id number] DOB: [birth year] Today's Date: 05/16/2012 Time: 4098-1191 OT Time Calculation (min): 24 min  OT Assessment / Plan / Recommendation Clinical Impression  56 yo s/p tumor excision and ulnar n decompression and neurolysis, ECRB ostectomy, arthotomy synovectomy R elbow. Completed all education regarding edema control, digit ROM and ADL. Pt to follow up with Dr. Butler Denmark and further therapy needs can be established at that time.    OT Assessment  All further OT needs can be met in the next venue of care    Follow Up Recommendations  Other (comment) (further outpt therapy as determined by MD)    Barriers to Discharge  none    Equipment Recommendations  None recommended by OT    Recommendations for Other Services  none  Frequency    eval only   Precautions / Restrictions Restrictions Weight Bearing Restrictions: No   Pertinent Vitals/Pain 7. nsg aware. Repositioned. Cold applied    ADL  Eating/Feeding: Simulated;Set up Grooming: Simulated;Minimal assistance Where Assessed - Grooming: Supported sitting Upper Body Bathing: Simulated;Moderate assistance Where Assessed - Upper Body Bathing: Supported sitting Lower Body Bathing: Simulated;Set up Where Assessed - Lower Body Bathing: Unsupported sit to stand Upper Body Dressing: Simulated;Moderate assistance Where Assessed - Upper Body Dressing: Unsupported sitting Lower Body Dressing: Simulated;Set up Where Assessed - Lower Body Dressing: Unsupported sit to stand Toilet Transfer: Performed;Modified independent Toilet Transfer Method: Sit to Barista: Comfort height toilet Toileting - Clothing Manipulation and Hygiene: Simulated;Modified independent Where Assessed - Toileting Clothing Manipulation and Hygiene: Standing Transfers/Ambulation Related to ADLs: Inidependent ADL Comments: Completed 1 handed ADL education    OT  Diagnosis: Generalized weakness;Acute pain  OT Problem List: Decreased strength;Decreased range of motion;Pain;Impaired UE functional use OT Treatment Interventions:     OT Goals Acute Rehab OT Goals OT Goal Formulation:  (eval only)  Visit Information  Last OT Received On: 05/16/12    Subjective Data      Prior Functioning  Vision/Perception  Home Living Lives With: Spouse Available Help at Discharge: Available PRN/intermittently Type of Home: House Home Access: Stairs to enter [employer] of Steps: 6 Home Layout: One level Firefighter: Standard Prior Function Level of Independence: Independent Able to Take Stairs?: Yes Driving: Yes Vocation: Full time employment Comments: Psychologist, educational: No difficulties Dominant Hand: Right      Cognition  Overall Cognitive Status: Appears within functional limits for tasks assessed/performed Arousal/Alertness: Awake/alert Orientation Level: Appears intact for tasks assessed Behavior During Session: [facility] for tasks performed    Extremity/Trunk Assessment Right Upper Extremity Assessment RUE ROM/Strength/Tone: Deficits;Due to pain;Due to precautions RUE Sensation: Deficits (ulnar n distribution - primarily little) RUE Coordination: Deficits Left Upper Extremity Assessment LUE ROM/Strength/Tone: WFL for tasks assessed Trunk Assessment Trunk Assessment: Normal   Mobility Bed Mobility Bed Mobility: Supine to Sit Details for Bed Mobility Assistance: independent Transfers Transfers: Sit to Stand;Stand to Sit Sit to Stand: 7: Independent   Exercise  digit ROM  Balance  WFL  End of Session OT - End of Session Activity Tolerance: Patient tolerated treatment well Patient left: in bed;with call bell/phone within reach;with family/visitor present Nurse Communication: Other (comment) (ulnar n sensory deficit)  GO     Raoul Ciano,HILLARY 05/16/2012, 10:52 AM Luisa Dago, OTR/L   [phone] 05/16/2012

## 2012-05-16 NOTE — Op Note (Signed)
[name], [name] NO.:  [id number]  MEDICAL RECORD NO.:  [id number]  LOCATION:  6N24C                        FACILITY:  [facility]  PHYSICIAN:  Dionne Ano. Ponce Skillman, M.D.DATE OF BIRTH:  [birth year]  DATE OF PROCEDURE: DATE OF DISCHARGE:                              OPERATIVE REPORT   PREOPERATIVE DIAGNOSES: 1. Large lobulated mass, right forearm with pain. 2. Chronic extensor carpi radialis brevis tendinosis, right elbow.  POSTOPERATIVE DIAGNOSES: 1. Large lobulated mass, right forearm with pain. 2. Chronic extensor carpi radialis brevis tendinosis, right elbow.  PROCEDURE: 1. Removal of a large greater than 15-cm mass, right forearm. 2. Ulnar nerve decompression and extensive neurolysis, right forearm     and elbow. 3. Extensor carpi radialis brevis debridement with partial ostectomy,     right elbow.  This was through a separate incision about the     lateral aspect of the elbow. 4. Arthrotomy synovectomy, limited in nature, right elbow.  SURGEON:  Dionne Ano. Amanda Pea, MD  ASSISTANT:  None.  COMPLICATIONS:  None.  ANESTHESIA:  General.  TOURNIQUET TIME:  An hour and 20 minutes.  There was a 55 minutes insufflation time followed by a deflation time, which was rather lengthy followed by a re-insufflation time for the second half of the procedure about the elbow.  DRAINS:  One.  INDICATIONS FOR THE PROCEDURE:  This patient is a pleasant male who presents with the above-mentioned diagnosis.  I have counseled him in regards to risks and benefits of surgery including risk of infection, bleeding, anesthesia, damage to normal structures, and failure of surgery to accomplish its intended goals of relieving symptoms and restoring function.  With this in mind, he desires to proceed.  All questions have been encouraged and answered preoperatively.  I have discussed with the patient that he has a very large mass, this is concerning.  I performed angiography to  make sure that this was not a vascular tumor and he did not appear to be.  I have examined him in great detail and at present time, plan to proceed with the above- mentioned operative intervention.  He understands the risks and benefits, do's and don'ts, etc.  With this in mind, we will proceed accordingly.  OPERATION DETAILS:  The patient was seen by myself and Anesthesia, taken to the operative suite, underwent a smooth induction of general anesthesia, laid spine, properly padded, prepped and draped in usual sterile fashion with Betadine scrub and paint about the right upper extremity.  Time-out was called.  Pre and postop checklist complete and following this, the arm was elevated.  Tourniquet was insufflated to 250 mmHg and a curvilinear incision was made over the medial aspect of the forearm.  This was a centimeter anterior to the subcutaneous border of the ulna on the medial side.  The patient had crossing veins cauterized. Following this, I very carefully and meticulously approached the mass. The mass was palpable and one could certainly feel the mass in the area in question.  I very carefully split the muscle fibers and encountered the mask.  It was a bilobed-type masses with significant investments in the musculature of the flexor pronator fascia of the forearm.  The patient had very significant investments in the muscle.  In this, I gave cause for concern in my opinion.  At this time, I enlarged the incision and identified the ulnar nerve as the mass was very close and in proximity to the ulnar nerve.  I identified the ulnar nerve proximally at the cubital tunnel.  I released the superficial and deep heads of the flexor carpi ulnaris, and then traced the nerve out distally.  This allowed me to trace the nerve and the mass together.  I split the muscle fibers carefully about the inferior half of the flexor carpi ulnaris. Following this, I was able to perform a distinct and  separate neurolysis and decompression of the ulnar nerve, swept it off of the area in question and I identified the mass.  The mass appeared to be in the muscle.  It was difficult to ascertain whether this was an old injury versus a process that arose de novo.  Nevertheless, the mass was circumferentially identified.  There were two bilobed components, which were removed as well as muscle fibers and muscle fibers with what appeared to be a heavy laden degree of fibrosis around them.  The mass was removed in its entirety.  Intraoperative pictures were taken as well as pictures with the mass removed.  I then sent this for frozen specimen.  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  At this point in time, I then irrigated it copiously as I did during multiple points during the procedure and following this, I made sure the nerve looked excellent which it did.  I then irrigated additionally, maintained excellent hemostatic control and ultimately, closed the wound over #7 TLS drain with 3-4 Vicryl sutures followed by 3-0 Prolene sutures used at the skin edge.  The compartments were soft.  I left a fascia released open and did not close the fascia.  Following this and with the frozen section confirming a benign condition, I then turned attention toward the lateral elbow where an incision was made.  A separate tourniquet was applied and the lateral elbow was approached through 1.5 to 2-inch incision, dissection was carried down and the patient then underwent ECRB debridement with partial ostectomy via creating an interval between the ECRL and the ECU, this was done without difficulty.  Debridement followed by partial ostectomy was accomplished.  Once this was done, I performed a limited synovectomy and arthrotomy of the elbow joint, which had noninfectious physiologic joint fluid noted.  I was pleased this in the findings. There were no  complicating issues and following this, I irrigated it copiously and closed the wound with pants-over-vest procedure with 2-0 FiberWire.  The healthy ECRL tissue was stepped against the ostectomy site and there were no complicating features.  The wound was closed with Vicryl followed by subcuticular Prolene and Steri-Strips.  The patient tolerated this well.  There were no complicating features.  All sponge, needle, and instrument counts were reported as correct.  Thus, the patient underwent a very large tumor mass removal as well as ulnar nerve decompression and neurolysis from the medial side.  This was an approximately 15-20 cm incision and was a very large mass with frozen section findings of a myxoma.  We will wait final pathologic diagnosis course.  Nevertheless, I was very pleased to see it was not wrapped around the nerve.  It did not invade the vascular main architecture and did look quite stable.  There  was one feeding branch off of the ulnar nerve, which communicated with the tumorous process and I de-clipped this.  However, the main portion of the ulnar nerve was not abnormal and appeared to be highly stable in my estimation.  At this time, as mentioned, I will wait pathologic confirmation for final path, but feel that this fits with intraoperative findings.  That this the presumed diagnosis of a myxoma and the intraoperative findings of a mass, which appeared to be in the muscular architecture despite one feeding branch from the ulnar nerve.  This certainly could represent a traumatic origin or other origin, it is difficult to say.  Nevertheless, I do feel that having this removed it going to be very helpful for Mr. Hreha.  We will rehab him accordingly to our ECRB debridement protocol, but a bit slower than usual given the large tumor removal.  Drain was hooked up to suction.  There were no complications.  All sponge, needle, and instrument counts were reported as  correct.  Long-arm splint was applied, and he was taken to the recovery room in stable condition.  I will keep him overnight for IV antibiotics and general pain control.     Dionne Ano. Amanda Pea, M.D.     [facility]  D:  05/15/2012  T:  05/16/2012  Job:  [id number]

## 2012-05-18 ENCOUNTER — Encounter (HOSPITAL_COMMUNITY): Payer: Self-pay | Admitting: Orthopedic Surgery

## 2013-06-11 IMAGING — US IR ANGIO/EXT/UNI*R*
1 series · 1 of 1 positions shown · non-contrast
Comparison: none

CLINICAL DATA: Soft tissue mass of the proximal right forearm.  MRI
evaluation has demonstrated increased vascularity and possible
vascular malformation.  Further characterization is performed by
arteriography.

[Series 1: ir angio/ext/uni*right* · 1 of 1 slices shown]
[im 1/1]
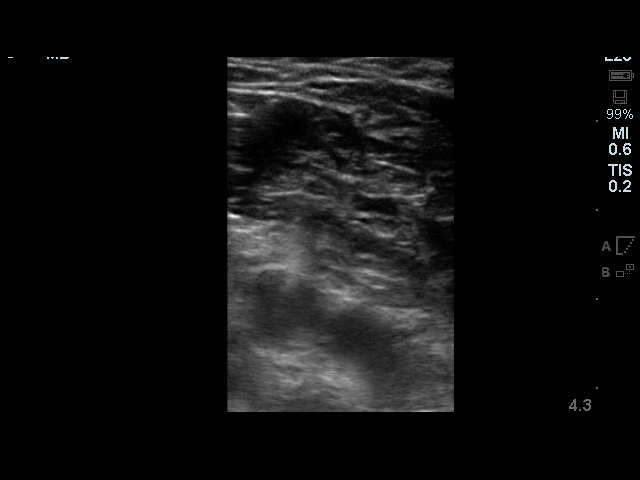

[1 of 1 positions shown; findings below may reference images not displayed]

1.  ULTRASOUND GUIDANCE FOR VASCULAR ACCESS OF THE RIGHT COMMON
FEMORAL ARTERY
2.  RIGHT UPPER EXTREMITY ARTERIOGRAPHY WITH CATHETERIZATION UP TO
THE LEVEL OF THE RIGHT BRACHIAL ARTERY.

Sedation: 3.5 mg IV Versed; 100 mcg IV Fentanyl.

Total Moderate Sedation Time: 45 minutes.

Contrast:  110 ml Visipaque 320

Additional Medications: 100 mcg intra-arterial nitroglycerin

Fluoroscopy Time: 5.7 minutes.

Procedure:  The procedure, risks, benefits, and alternatives were
explained to the patient.  Questions regarding the procedure were
encouraged and answered.  The patient understands and consents to
the procedure.

Both groin regions were prepped with Betadine in a sterile fashion,
and a sterile drape was applied covering the operative field.  A
sterile gown and sterile gloves were used for the procedure.  Local
anesthesia was provided with 1% Lidocaine.

Ultrasound guided access of the right common femoral artery was
performed utilizing a micropuncture set.  Ultrasound image
documentation was performed.  A guidewire was advanced to the level
of the iliac arteries.  A 5-French sheath was then advanced over
the wire.

A 5-French JB-1 catheter was advanced into the aorta.  This was
used to initially catheterize the innominate artery.  Selective
arteriography was performed.  The catheter was further advanced
into the subclavian artery and additional selective arteriography
performed.  The catheter was then advanced into the proximal
brachial artery and further selective arteriography performed with
full visualization of arterial supply to the level of the hand.
Arteriographic runs were carried through into the venous phase.
Oblique imaging was performed magnified over the proximal forearm
and included runs performed after intra-arterial administration of
nitroglycerin to promote vasodilatation.

After the procedure catheter and sheath were removed.  Hemostasis
was obtained utilizing the Cordis ExoSeal device and manual
compression.

Complications: None
FINDINGS: Proximal arterial supply of the right upper extremity is
normal including normally patent innominate artery, subclavian
artery and axillary artery.  The visualized proximal right common
carotid artery and right vertebral artery show normal patency.

The brachial artery is normally patent.  In the proximal forearm,
the ulnar artery is displaced and mildly tortuous at the level of
ovoid soft tissue enhancement corresponding to the region of the
patient's soft tissue mass.  Small branch vessels from the proximal
ulnar artery supply this region with a gradual blush noted at the
level of a dominant ovoid mass.  There also appears to be an
additional smaller area of blush in the proximal forearm just
proximal to the dominant area of abnormal vascularity.

The level of abnormal vascularity does not have the appearance of
an arteriovenous malformation, hemangioma or other type of venous
malformation.  No visualized early draining veins.  No evidence of
arterial pseudoaneurysm or AV fistula.  Arteriographic appearance
is consistent with some type of soft tissue mass.

The ulnar, interosseous and radial arteries show normal patency to
the level of the wrist.  Arteries of the hand show normal patency
with an incomplete arch present.  Normal venous drainage is seen
throughout the right upper extremity.
IMPRESSION: Abnormal vascularity at the level of the proximal forearm supplied
by branches of the ulnar artery.  Dominant area of ovoid
enhancement has the appearance of a soft tissue mass and it is
exerting some mass effect on the proximal ulnar artery.  There is
no evidence by arteriography of vascular malformation or
hemangioma.  There also is an area of smaller nodular enhancement
just proximal to the dominant area in the proximal forearm.

## 2013-11-30 ENCOUNTER — Other Ambulatory Visit: Payer: Self-pay | Admitting: Physician Assistant

## 2015-11-30 ENCOUNTER — Other Ambulatory Visit: Payer: Self-pay | Admitting: Otolaryngology

## 2015-11-30 DIAGNOSIS — H9190 Unspecified hearing loss, unspecified ear: Secondary | ICD-10-CM

## 2015-11-30 DIAGNOSIS — R42 Dizziness and giddiness: Secondary | ICD-10-CM

## 2015-12-12 ENCOUNTER — Ambulatory Visit
Admission: RE | Admit: 2015-12-12 | Discharge: 2015-12-12 | Disposition: A | Discharge status: Home / Self Care | Payer: BLUE CROSS/BLUE SHIELD | Source: Ambulatory Visit | Attending: Otolaryngology | Admitting: Otolaryngology

## 2015-12-12 DIAGNOSIS — R42 Dizziness and giddiness: Secondary | ICD-10-CM

## 2015-12-12 DIAGNOSIS — H9190 Unspecified hearing loss, unspecified ear: Secondary | ICD-10-CM

## 2015-12-12 MED ORDER — GADOBENATE DIMEGLUMINE 529 MG/ML IV SOLN
20.0000 mL | Freq: Once | INTRAVENOUS | Status: AC | PRN
  Administered 2015-12-12: 20 mL via INTRAVENOUS

## 2015-12-20 DIAGNOSIS — IMO0001 Reserved for inherently not codable concepts without codable children: Secondary | ICD-10-CM | POA: Insufficient documentation

## 2016-02-11 DIAGNOSIS — Z0000 Encounter for general adult medical examination without abnormal findings: Secondary | ICD-10-CM | POA: Insufficient documentation

## 2016-02-11 DIAGNOSIS — E349 Endocrine disorder, unspecified: Secondary | ICD-10-CM | POA: Insufficient documentation

## 2016-02-11 DIAGNOSIS — G4733 Obstructive sleep apnea (adult) (pediatric): Secondary | ICD-10-CM | POA: Insufficient documentation

## 2016-02-11 DIAGNOSIS — J309 Allergic rhinitis, unspecified: Secondary | ICD-10-CM | POA: Insufficient documentation

## 2016-02-11 DIAGNOSIS — K219 Gastro-esophageal reflux disease without esophagitis: Secondary | ICD-10-CM | POA: Insufficient documentation

## 2016-02-12 DIAGNOSIS — R739 Hyperglycemia, unspecified: Secondary | ICD-10-CM | POA: Insufficient documentation

## 2016-05-09 DIAGNOSIS — H9319 Tinnitus, unspecified ear: Secondary | ICD-10-CM | POA: Insufficient documentation

## 2017-02-19 IMAGING — MR MR HEAD WO/W CM
10 of 11 series · 31 of 48 positions shown · IV contrast (multihance)
Comparison: None.

CLINICAL DATA: Left-sided hearing loss for 2 years, progressively
worsening. Dizziness.

EXAM:
MRI HEAD WITHOUT AND WITH CONTRAST
TECHNIQUE: Multiplanar, multiecho pulse sequences of the brain and surrounding
structures were obtained without and with intravenous contrast.
CONTRAST:  20mL MULTIHANCE GADOBENATE DIMEGLUMINE 529 MG/ML IV SOLN

[Series 2: T1 · sagittal · 5.0mm · 0.45mm/px · 3 of 19 slices shown (1 of 4)]
[im 1/19]
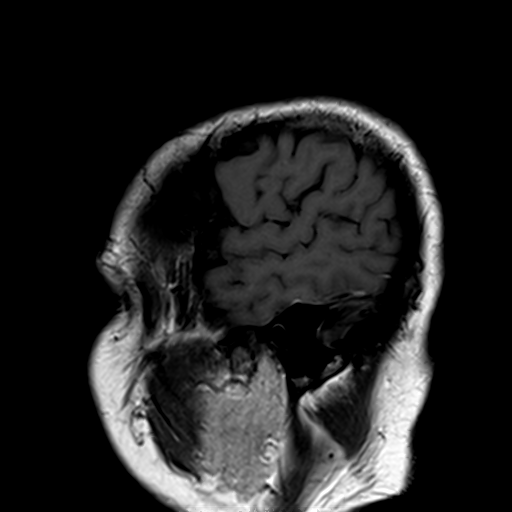
[im 10/19]
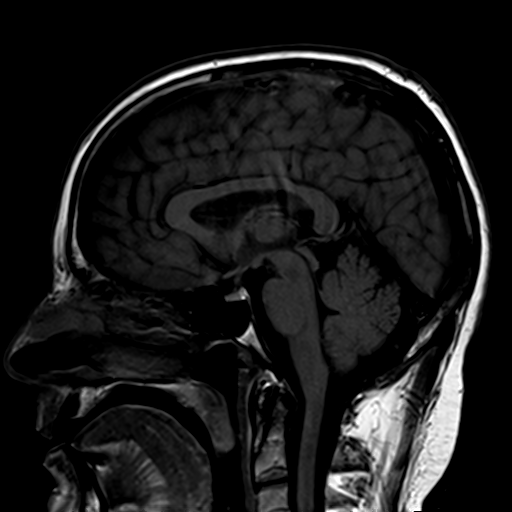
[im 19/19]
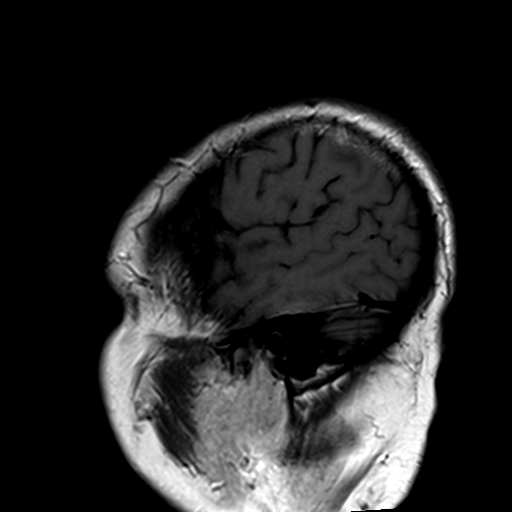

[Series 3: T2 · axial · 5.0mm · 0.45mm/px · z∈[-94,+43]mm · 4 of 23 slices shown]
[im 1/23]
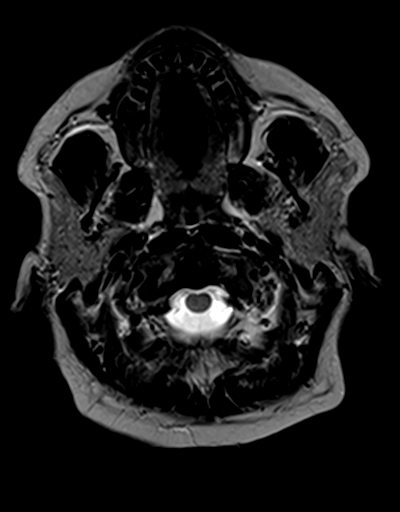
[im 8/23]
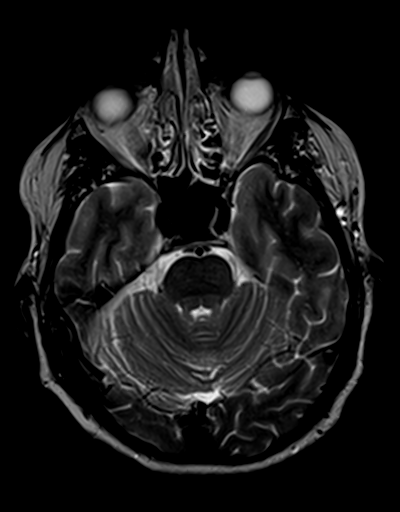
[im 15/23]
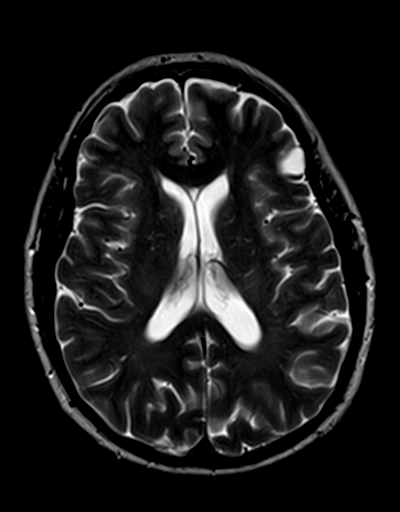
[im 23/23]
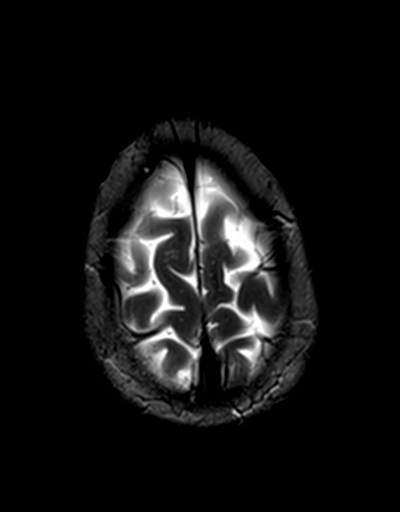

[Series 4: ep2d_diff_(id)_trace · axial · 3.0mm · 1.80mm/px · z∈[-100,+47]mm · 8 of 100 slices shown]
[im 1/100]
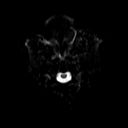
[im 17/100]
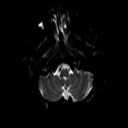
[im 34/100]
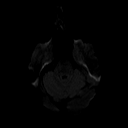
[im 42/100]
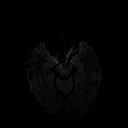
[im 58/100]
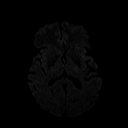
[im 67/100]
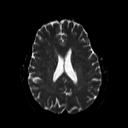
[im 83/100]
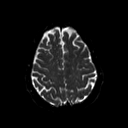
[im 100/100]
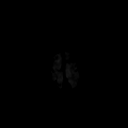

[Series 5: ep2d_diff_(id)_trace_adc · axial · 3.0mm · 1.80mm/px · z∈[-100,+47]mm · 6 of 48 slices shown]
[im 1/48]
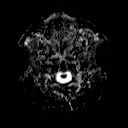
[im 10/48]
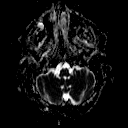
[im 19/48]
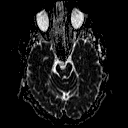
[im 29/48]
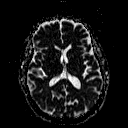
[im 38/48]
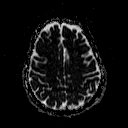
[im 48/48]
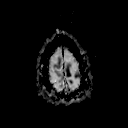

[Series 6: T1 · coronal · 3.0mm · 0.35mm/px · 1 of 11 slices shown (2 of 4)]
[im 1/11]
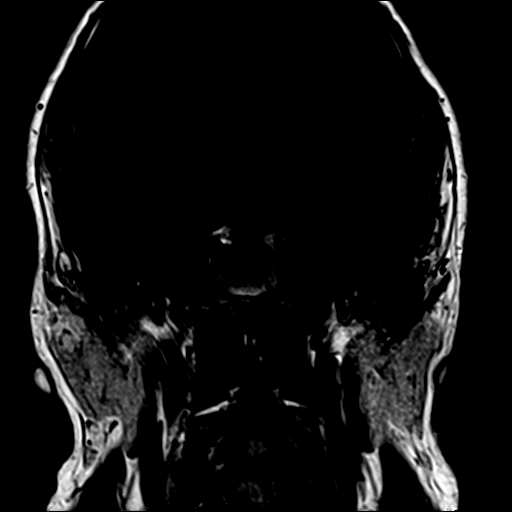

[Series 7: FLAIR · axial · 5.0mm · 0.45mm/px · z∈[-95,+42]mm · 3 of 23 slices shown]
[im 1/23]
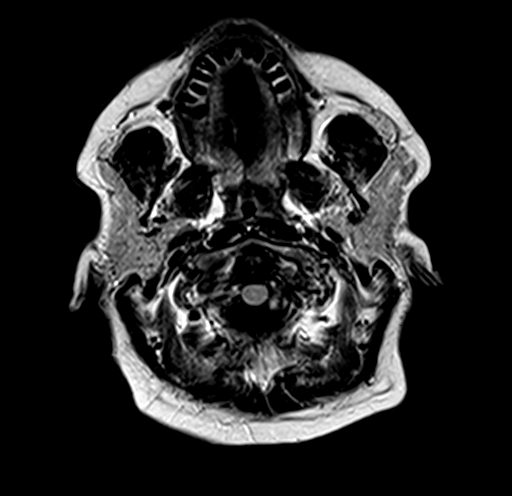
[im 12/23]
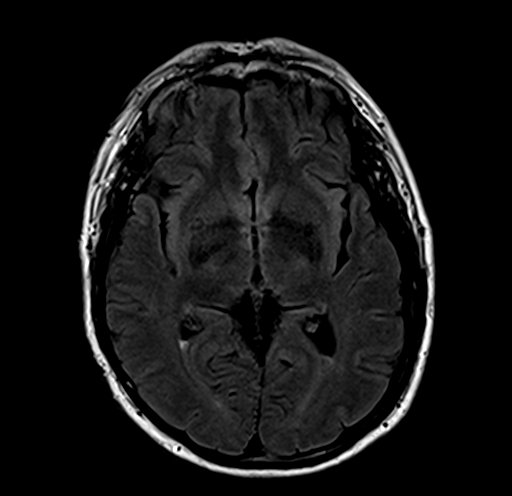
[im 23/23]
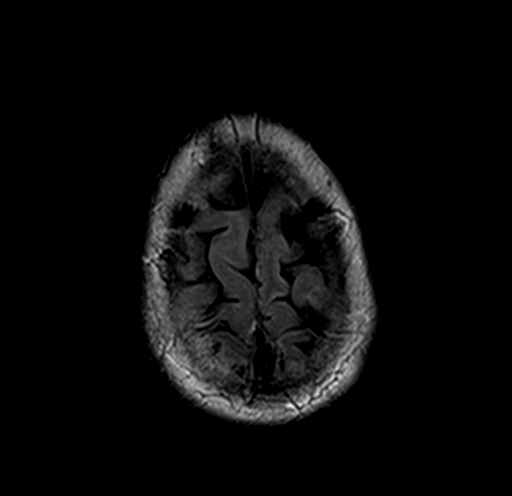

[Series 8: T1 · axial · 3.0mm · 0.35mm/px · 1 of 11 slices shown (3 of 4)]
[im 1/11]
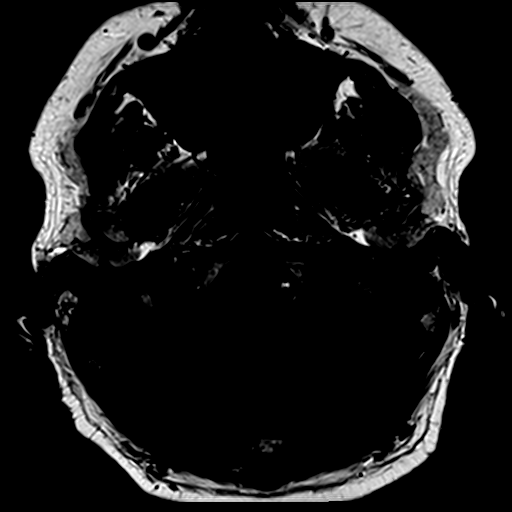

[Series 9: bSSFP · axial · 0.7mm · 0.28mm/px · z∈[-72,-60]mm · 3 of 44 slices shown]
[im 1/44]
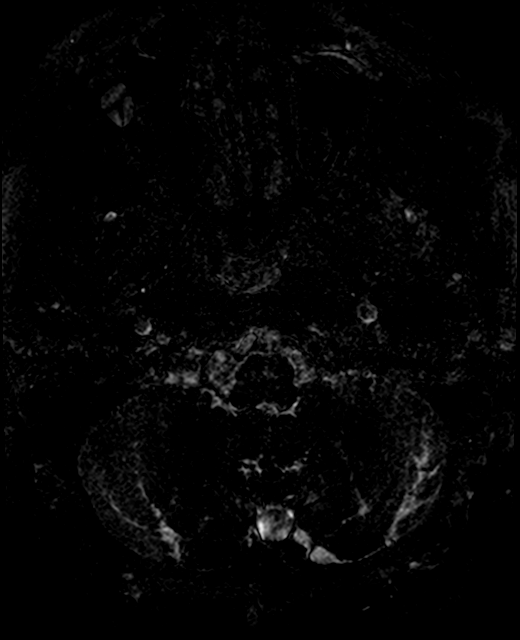
[im 9/44]
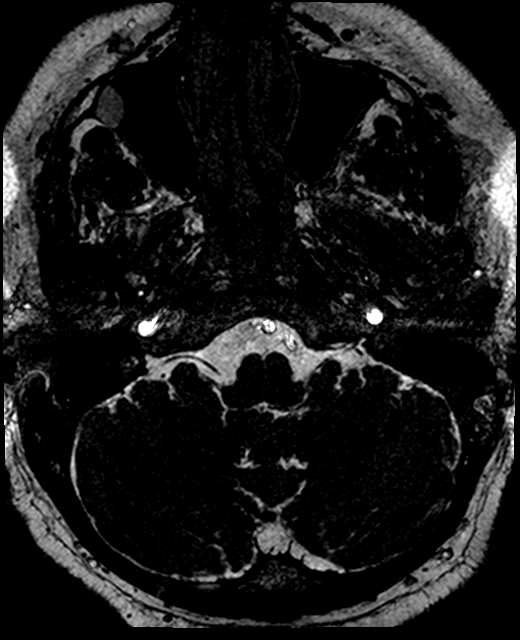
[im 18/44]
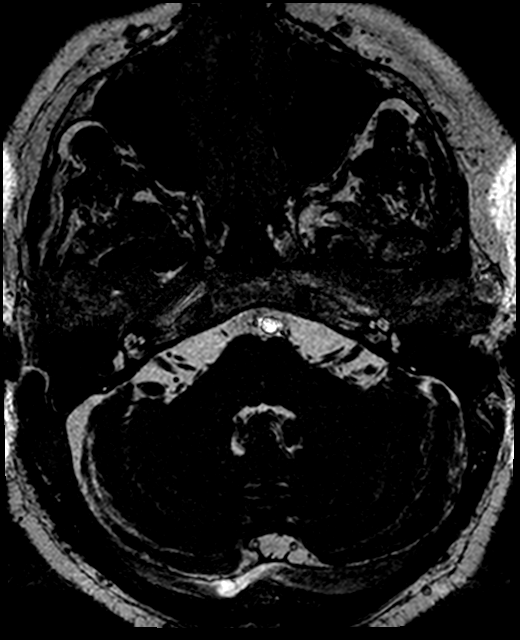

[Series 10: T1 · coronal · 3.0mm · 0.35mm/px · 1 of 11 slices shown (4 of 4)]
[im 1/11]
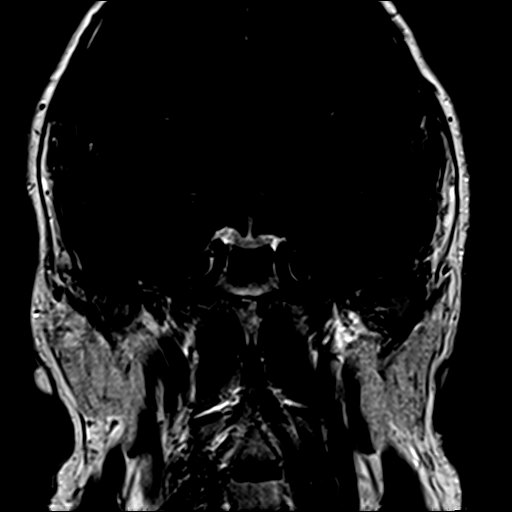

[Series 11: T1 post-contrast · axial · 3.0mm · 0.35mm/px · 1 of 11 slices shown]
[im 1/11]
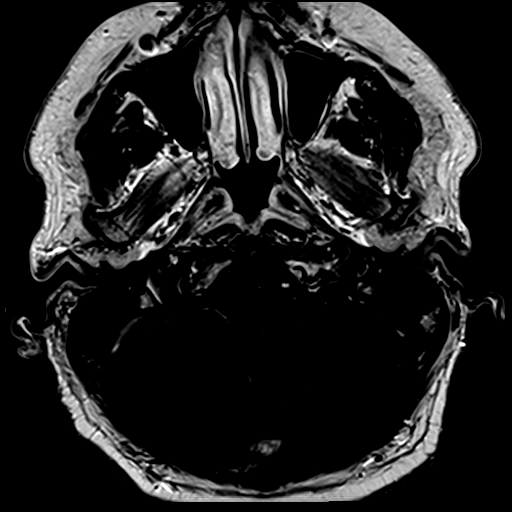

[31 of 48 positions shown; findings below may reference images not displayed]

FINDINGS: There is no evidence of acute infarct, intracranial hemorrhage,
midline shift, or extra-axial fluid collection. Ventricles and sulci
are within normal limits for age. Punctate, scattered foci of T2
hyperintensity are present in the periventricular and subcortical
cerebral white matter bilaterally, at most minimally advanced for
age.

There is a 13 mm T2 hyperintense lesion at the level of the left
frontal operculum which appears cystic, following CSF signal
intensity. It is not completely clear whether this lesion is located
in the cortex or whether it is extra-axial, with evaluation
partially hampered by limited whole brain imaging on this IAC
protocol examination. No lesional enhancement is seen, however there
is a thin rim of FLAIR hyperintensity along the medial and posterior
margins of the lesion.

The orbits are unremarkable. A small right maxillary mucous
retention cyst is noted. There is minimal bilateral ethmoid and left
maxillary sinus mucosal thickening. The mastoid air cells are clear.
Major intracranial vascular flow voids are preserved.

Dedicated imaging through the internal auditory canals demonstrates
a normal course of cranial nerves VII and VIII without evidence of
mass or abnormal enhancement. Inner ear structures demonstrate
normal signal bilaterally. No mass is seen within the
cerebellopontine angles.
IMPRESSION: 1. Unremarkable appearance of the internal auditory canals. No
evidence of vestibular schwannoma.
2. 13 mm nonenhancing left frontal operculum lesion. This may
represent a benign cyst, however given the thin rim of surrounding
gliosis or edema and the uncertainty as to its intra-axial or
extra-axial location on this somewhat limited study, a follow-up
routine brain MRI without and with IV contrast (which will include
whole brain coronal sequences) is recommended in 6 months to assess
its stability and help exclude neoplasm.
3. Minimal nonspecific cerebral white matter disease.

## 2018-09-08 DIAGNOSIS — Z6832 Body mass index (BMI) 32.0-32.9, adult: Secondary | ICD-10-CM | POA: Diagnosis not present

## 2018-09-08 DIAGNOSIS — G930 Cerebral cysts: Secondary | ICD-10-CM | POA: Diagnosis not present

## 2018-09-08 DIAGNOSIS — R42 Dizziness and giddiness: Secondary | ICD-10-CM | POA: Diagnosis not present

## 2018-09-10 DIAGNOSIS — H8190 Unspecified disorder of vestibular function, unspecified ear: Secondary | ICD-10-CM | POA: Diagnosis not present

## 2018-09-10 DIAGNOSIS — R42 Dizziness and giddiness: Secondary | ICD-10-CM | POA: Diagnosis not present

## 2018-09-10 DIAGNOSIS — R269 Unspecified abnormalities of gait and mobility: Secondary | ICD-10-CM | POA: Diagnosis not present

## 2018-09-16 DIAGNOSIS — R269 Unspecified abnormalities of gait and mobility: Secondary | ICD-10-CM | POA: Diagnosis not present

## 2018-09-16 DIAGNOSIS — H8190 Unspecified disorder of vestibular function, unspecified ear: Secondary | ICD-10-CM | POA: Diagnosis not present

## 2018-09-16 DIAGNOSIS — R42 Dizziness and giddiness: Secondary | ICD-10-CM | POA: Diagnosis not present

## 2018-09-21 DIAGNOSIS — H8190 Unspecified disorder of vestibular function, unspecified ear: Secondary | ICD-10-CM | POA: Diagnosis not present

## 2018-09-21 DIAGNOSIS — R269 Unspecified abnormalities of gait and mobility: Secondary | ICD-10-CM | POA: Diagnosis not present

## 2018-09-21 DIAGNOSIS — R42 Dizziness and giddiness: Secondary | ICD-10-CM | POA: Diagnosis not present

## 2018-09-28 DIAGNOSIS — H8190 Unspecified disorder of vestibular function, unspecified ear: Secondary | ICD-10-CM | POA: Diagnosis not present

## 2018-09-28 DIAGNOSIS — R42 Dizziness and giddiness: Secondary | ICD-10-CM | POA: Diagnosis not present

## 2018-09-28 DIAGNOSIS — R269 Unspecified abnormalities of gait and mobility: Secondary | ICD-10-CM | POA: Diagnosis not present

## 2018-10-06 DIAGNOSIS — H8190 Unspecified disorder of vestibular function, unspecified ear: Secondary | ICD-10-CM | POA: Diagnosis not present

## 2018-10-06 DIAGNOSIS — R42 Dizziness and giddiness: Secondary | ICD-10-CM | POA: Diagnosis not present

## 2018-10-06 DIAGNOSIS — R269 Unspecified abnormalities of gait and mobility: Secondary | ICD-10-CM | POA: Diagnosis not present

## 2018-10-29 DIAGNOSIS — R269 Unspecified abnormalities of gait and mobility: Secondary | ICD-10-CM | POA: Diagnosis not present

## 2018-10-29 DIAGNOSIS — H8190 Unspecified disorder of vestibular function, unspecified ear: Secondary | ICD-10-CM | POA: Diagnosis not present

## 2018-10-29 DIAGNOSIS — R42 Dizziness and giddiness: Secondary | ICD-10-CM | POA: Diagnosis not present

## 2018-11-09 DIAGNOSIS — H1132 Conjunctival hemorrhage, left eye: Secondary | ICD-10-CM | POA: Diagnosis not present

## 2018-11-16 DIAGNOSIS — Z125 Encounter for screening for malignant neoplasm of prostate: Secondary | ICD-10-CM | POA: Diagnosis not present

## 2018-11-16 DIAGNOSIS — Z131 Encounter for screening for diabetes mellitus: Secondary | ICD-10-CM | POA: Diagnosis not present

## 2018-11-16 DIAGNOSIS — Z13220 Encounter for screening for lipoid disorders: Secondary | ICD-10-CM | POA: Diagnosis not present

## 2018-11-16 DIAGNOSIS — Z1159 Encounter for screening for other viral diseases: Secondary | ICD-10-CM | POA: Diagnosis not present

## 2018-11-16 DIAGNOSIS — Z0000 Encounter for general adult medical examination without abnormal findings: Secondary | ICD-10-CM | POA: Diagnosis not present

## 2019-03-17 DIAGNOSIS — H9312 Tinnitus, left ear: Secondary | ICD-10-CM | POA: Diagnosis not present

## 2019-03-17 DIAGNOSIS — I739 Peripheral vascular disease, unspecified: Secondary | ICD-10-CM | POA: Diagnosis not present

## 2019-03-17 DIAGNOSIS — R42 Dizziness and giddiness: Secondary | ICD-10-CM | POA: Diagnosis not present

## 2019-03-17 DIAGNOSIS — G930 Cerebral cysts: Secondary | ICD-10-CM | POA: Diagnosis not present

## 2019-03-25 DIAGNOSIS — G930 Cerebral cysts: Secondary | ICD-10-CM | POA: Diagnosis not present

## 2019-03-25 DIAGNOSIS — H9392 Unspecified disorder of left ear: Secondary | ICD-10-CM | POA: Diagnosis not present

## 2019-03-25 DIAGNOSIS — R42 Dizziness and giddiness: Secondary | ICD-10-CM | POA: Diagnosis not present

## 2019-03-31 DIAGNOSIS — H9313 Tinnitus, bilateral: Secondary | ICD-10-CM | POA: Diagnosis not present

## 2019-03-31 DIAGNOSIS — I6523 Occlusion and stenosis of bilateral carotid arteries: Secondary | ICD-10-CM | POA: Diagnosis not present

## 2019-03-31 DIAGNOSIS — I739 Peripheral vascular disease, unspecified: Secondary | ICD-10-CM | POA: Diagnosis not present

## 2019-11-26 DIAGNOSIS — Z6833 Body mass index (BMI) 33.0-33.9, adult: Secondary | ICD-10-CM | POA: Diagnosis not present

## 2019-11-26 DIAGNOSIS — Z125 Encounter for screening for malignant neoplasm of prostate: Secondary | ICD-10-CM | POA: Diagnosis not present

## 2019-11-26 DIAGNOSIS — Z13220 Encounter for screening for lipoid disorders: Secondary | ICD-10-CM | POA: Diagnosis not present

## 2019-11-26 DIAGNOSIS — E669 Obesity, unspecified: Secondary | ICD-10-CM | POA: Diagnosis not present

## 2019-11-26 DIAGNOSIS — Z1331 Encounter for screening for depression: Secondary | ICD-10-CM | POA: Diagnosis not present

## 2019-11-26 DIAGNOSIS — Z0000 Encounter for general adult medical examination without abnormal findings: Secondary | ICD-10-CM | POA: Diagnosis not present

## 2019-12-08 DIAGNOSIS — K219 Gastro-esophageal reflux disease without esophagitis: Secondary | ICD-10-CM | POA: Diagnosis not present

## 2019-12-08 DIAGNOSIS — K4020 Bilateral inguinal hernia, without obstruction or gangrene, not specified as recurrent: Secondary | ICD-10-CM | POA: Diagnosis not present

## 2019-12-08 DIAGNOSIS — R1032 Left lower quadrant pain: Secondary | ICD-10-CM | POA: Diagnosis not present

## 2019-12-20 DIAGNOSIS — K4020 Bilateral inguinal hernia, without obstruction or gangrene, not specified as recurrent: Secondary | ICD-10-CM | POA: Diagnosis not present

## 2019-12-20 DIAGNOSIS — Z20822 Contact with and (suspected) exposure to covid-19: Secondary | ICD-10-CM | POA: Diagnosis not present

## 2019-12-20 DIAGNOSIS — Z01812 Encounter for preprocedural laboratory examination: Secondary | ICD-10-CM | POA: Diagnosis not present

## 2019-12-22 DIAGNOSIS — K4020 Bilateral inguinal hernia, without obstruction or gangrene, not specified as recurrent: Secondary | ICD-10-CM | POA: Diagnosis not present

## 2019-12-22 DIAGNOSIS — I252 Old myocardial infarction: Secondary | ICD-10-CM | POA: Diagnosis not present

## 2019-12-22 DIAGNOSIS — R9431 Abnormal electrocardiogram [ECG] [EKG]: Secondary | ICD-10-CM | POA: Diagnosis not present

## 2019-12-22 DIAGNOSIS — Z01810 Encounter for preprocedural cardiovascular examination: Secondary | ICD-10-CM | POA: Diagnosis not present

## 2019-12-27 DIAGNOSIS — K4020 Bilateral inguinal hernia, without obstruction or gangrene, not specified as recurrent: Secondary | ICD-10-CM | POA: Diagnosis not present

## 2019-12-27 DIAGNOSIS — K4030 Unilateral inguinal hernia, with obstruction, without gangrene, not specified as recurrent: Secondary | ICD-10-CM | POA: Diagnosis not present

## 2019-12-27 DIAGNOSIS — K4090 Unilateral inguinal hernia, without obstruction or gangrene, not specified as recurrent: Secondary | ICD-10-CM | POA: Diagnosis not present

## 2019-12-27 DIAGNOSIS — K4000 Bilateral inguinal hernia, with obstruction, without gangrene, not specified as recurrent: Secondary | ICD-10-CM | POA: Diagnosis not present

## 2020-07-18 DIAGNOSIS — R42 Dizziness and giddiness: Secondary | ICD-10-CM | POA: Insufficient documentation

## 2020-07-26 ENCOUNTER — Other Ambulatory Visit: Payer: Self-pay

## 2020-07-26 ENCOUNTER — Encounter: Payer: Self-pay | Admitting: Sports Medicine

## 2020-07-26 ENCOUNTER — Ambulatory Visit (INDEPENDENT_AMBULATORY_CARE_PROVIDER_SITE_OTHER): Payer: BC Managed Care – PPO | Admitting: Sports Medicine

## 2020-07-26 DIAGNOSIS — B351 Tinea unguium: Secondary | ICD-10-CM | POA: Diagnosis not present

## 2020-07-26 DIAGNOSIS — M79674 Pain in right toe(s): Secondary | ICD-10-CM

## 2020-07-26 NOTE — Progress Notes (Signed)
Subjective: Shawn [name] is a 64 y.o. male patient seen today in office with complaint of mildly painful thickened and discolored right great toenail.  Reports that he likely could have stubbed the toe and has noticed some blood to the area and thought that it would grow out and get better but then when it did not he started using an over-the-counter fungal nail medication for several weeks without any improvement.  Reports that the nail is becoming difficult to manage because of the thickness. Patient has no other pedal complaints at this time.  Review of systems noncontributory  Patient Active Problem List   Diagnosis Date Noted  . Dizziness 07/18/2020  . Tinnitus 05/09/2016  . Hyperglycemia 02/12/2016  . Allergic rhinitis 02/11/2016  . GERD (gastroesophageal reflux disease) 02/11/2016  . Hypotestosteronism 02/11/2016  . Obstructive sleep apnea 02/11/2016  . Wellness examination 02/11/2016  . Asymmetrical hearing loss of left ear 12/20/2015    No current outpatient medications on file prior to visit.   No current facility-administered medications on file prior to visit.    No Known Allergies  Objective: Physical Exam  General: Well developed, nourished, no acute distress, awake, alert and oriented x 3  Vascular: Dorsalis pedis artery 2/4 bilateral, Posterior tibial artery 2/4 bilateral, skin temperature warm to warm proximal to distal bilateral lower extremities, no varicosities, pedal hair present bilateral.  Neurological: Gross sensation present via light touch bilateral.   Dermatological: Skin is warm, dry, and supple bilateral, right hallux nail is tender, short thick, and discolored with mild subungal debris, proximal transverse trauma line, and there is also dried blood or a dark appearing pigment noted to the right lateral aspect of the hallux nail with no signs of infection, no webspace macerations present bilateral, no open lesions present bilateral, no  callus/corns/hyperkeratotic tissue present bilateral.  Musculoskeletal: No symptomatic boney deformities noted bilateral. Muscular strength within normal limits without painon range of motion. No pain with calf compression bilateral.  Assessment and Plan:  Problem List Items Addressed This Visit    None    Visit Diagnoses    Nail fungus    -  Primary   Toe pain, right          -Examined patient -Discussed treatment options for painful dystrophic right great toenail -Patient declined nail avulsion procedure at this time -Fungal culture was obtained by removing a portion of the hard nail itself from each of the involved toenail of the right great toe using a sterile nail nipper and sent to [facility]. Patient tolerated the biopsy procedure well without discomfort or need for anesthesia.  -Patient to return in 4 weeks for follow up evaluation and discussion of fungal culture results or sooner if symptoms worsen.  Landis Martins, DPM

## 2020-07-27 DIAGNOSIS — R42 Dizziness and giddiness: Secondary | ICD-10-CM | POA: Diagnosis not present

## 2020-07-27 DIAGNOSIS — Z0289 Encounter for other administrative examinations: Secondary | ICD-10-CM | POA: Diagnosis not present

## 2020-07-27 DIAGNOSIS — G4733 Obstructive sleep apnea (adult) (pediatric): Secondary | ICD-10-CM | POA: Diagnosis not present

## 2020-07-27 DIAGNOSIS — R9431 Abnormal electrocardiogram [ECG] [EKG]: Secondary | ICD-10-CM | POA: Diagnosis not present

## 2020-07-28 DIAGNOSIS — L608 Other nail disorders: Secondary | ICD-10-CM | POA: Diagnosis not present

## 2020-07-28 DIAGNOSIS — B351 Tinea unguium: Secondary | ICD-10-CM | POA: Diagnosis not present

## 2020-07-28 NOTE — Addendum Note (Signed)
Addended by: Cranford Mon R on: 07/28/2020 08:00 AM   Modules accepted: Orders

## 2020-08-09 DIAGNOSIS — R42 Dizziness and giddiness: Secondary | ICD-10-CM | POA: Diagnosis not present

## 2020-08-09 DIAGNOSIS — R9431 Abnormal electrocardiogram [ECG] [EKG]: Secondary | ICD-10-CM | POA: Diagnosis not present

## 2020-08-23 ENCOUNTER — Encounter: Payer: Self-pay | Admitting: Sports Medicine

## 2020-08-23 ENCOUNTER — Ambulatory Visit (INDEPENDENT_AMBULATORY_CARE_PROVIDER_SITE_OTHER): Payer: BC Managed Care – PPO | Admitting: Sports Medicine

## 2020-08-23 ENCOUNTER — Other Ambulatory Visit: Payer: Self-pay

## 2020-08-23 DIAGNOSIS — M722 Plantar fascial fibromatosis: Secondary | ICD-10-CM

## 2020-08-23 DIAGNOSIS — M79674 Pain in right toe(s): Secondary | ICD-10-CM

## 2020-08-23 DIAGNOSIS — L603 Nail dystrophy: Secondary | ICD-10-CM

## 2020-08-23 MED ORDER — TRIAMCINOLONE ACETONIDE 10 MG/ML IJ SUSP
10.0000 mg | Freq: Once | INTRAMUSCULAR | Status: AC
  Administered 2020-08-23: 10 mg

## 2020-08-23 NOTE — Progress Notes (Signed)
Subjective: Shawn [name] is a 64 y.o. male patient seen today in office for fungal culture results. 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 Patient has no other pedal complaints at this time.   Patient Active Problem List   Diagnosis Date Noted   Dizziness 07/18/2020   Tinnitus 05/09/2016   Hyperglycemia 02/12/2016   Allergic rhinitis 02/11/2016   GERD (gastroesophageal reflux disease) 02/11/2016   Hypotestosteronism 02/11/2016   Obstructive sleep apnea 02/11/2016   Wellness examination 02/11/2016   Asymmetrical hearing loss of left ear 12/20/2015    Current Outpatient Medications on File Prior to Visit  Medication Sig Dispense Refill   meclizine (ANTIVERT) 25 MG tablet Take 1 tablet by mouth 2 (two) times daily.     magnesium oxide (MAG-OX) 400 MG tablet Take by mouth.     No current facility-administered medications on file prior to visit.    No Known Allergies  Objective: Physical Exam  General: Well developed, nourished, no acute distress, awake, alert and oriented x 3  Vascular: Dorsalis pedis artery 2/4 bilateral, Posterior tibial artery 2/4 bilateral, skin temperature warm to warm proximal to distal bilateral lower extremities, no varicosities, pedal hair present bilateral.  Neurological: Gross sensation present via light touch bilateral.   Dermatological: Skin is warm, dry, and supple bilateral, right hallux, short thick, and discolored with mild subungal heme and trauma line, no webspace macerations present bilateral, no open lesions present bilateral, no callus/corns/hyperkeratotic tissue present bilateral. No signs of infection bilateral.  Musculoskeletal: + pain to insertion at right and left heel consistent with plantar fasciitis. Muscular strength within normal limits without painon range of motion. No pain with calf compression  bilateral.  Fungal culture negative suggestive of microtrauma  Assessment and Plan:  Problem List Items Addressed This Visit    None    Visit Diagnoses    Nail dystrophy    -  Primary   Toe pain, right       Plantar fasciitis, bilateral       Relevant Medications   triamcinolone acetonide (KENALOG) 10 MG/ML injection 10 mg (Completed) (Start on 08/23/2020  9:00 PM)      -Examined patient -Discussed treatment options for non-mycotic nail -Advised patient that nail changes at right hallux will grow out -May try tea tree oil or ACV -Discussed treatment for plantar fasciitis  -After oral consent and aseptic prep, injected a mixture containing 1 ml of 2%  plain lidocaine, 1 ml 0.5% plain marcaine, 0.5 ml of kenalog 10 and 0.5 ml of dexamethasone phosphate into right and left heel without complication. Post-injection care discussed with patient.  -Recommend stretching, good supportive shoes, icing and heel lifts as instructed -Patient to return if fails to improve or sooner if symptoms worsen.  Landis Martins, DPM

## 2020-09-01 DIAGNOSIS — R42 Dizziness and giddiness: Secondary | ICD-10-CM | POA: Diagnosis not present

## 2020-10-05 DIAGNOSIS — R079 Chest pain, unspecified: Secondary | ICD-10-CM | POA: Diagnosis not present

## 2020-10-05 DIAGNOSIS — R509 Fever, unspecified: Secondary | ICD-10-CM | POA: Diagnosis not present

## 2020-10-05 DIAGNOSIS — J989 Respiratory disorder, unspecified: Secondary | ICD-10-CM | POA: Diagnosis not present

## 2020-10-05 DIAGNOSIS — K219 Gastro-esophageal reflux disease without esophagitis: Secondary | ICD-10-CM | POA: Diagnosis not present

## 2020-10-06 ENCOUNTER — Other Ambulatory Visit: Payer: Self-pay | Admitting: Family

## 2020-10-06 ENCOUNTER — Telehealth: Payer: Self-pay | Admitting: Family

## 2020-10-06 DIAGNOSIS — G4733 Obstructive sleep apnea (adult) (pediatric): Secondary | ICD-10-CM

## 2020-10-06 DIAGNOSIS — U071 COVID-19: Secondary | ICD-10-CM

## 2020-10-06 DIAGNOSIS — Z609 Problem related to social environment, unspecified: Secondary | ICD-10-CM

## 2020-10-06 NOTE — Progress Notes (Signed)
I connected by phone with Shawn [name] on 10/06/2020 at 3:57 PM to discuss the potential use of a new treatment for mild to moderate COVID-19 viral infection in non-hospitalized patients.  This patient is a 64 y.o. male that meets the FDA criteria for Emergency Use Authorization of COVID monoclonal antibody casirivimab/imdevimab, bamlanivimab/eteseviamb, or sotrovimab.  Has a (+) direct SARS-CoV-2 viral test result  Has mild or moderate COVID-19   Is NOT hospitalized due to COVID-19  Is within 10 days of symptom onset  Has at least one of the high risk factor(s) for progression to severe COVID-19 and/or hospitalization as defined in EUA.  Specific high risk criteria : BMI > 25, Chronic Lung Disease and Other high risk medical condition per CDC:  High SVI score   I have spoken and communicated the following to the patient or parent/caregiver regarding COVID monoclonal antibody treatment:  1. FDA has authorized the emergency use for the treatment of mild to moderate COVID-19 in adults and pediatric patients with positive results of direct SARS-CoV-2 viral testing who are 80 years of age and older weighing at least 40 kg, and who are at high risk for progressing to severe COVID-19 and/or hospitalization.  2. The significant known and potential risks and benefits of COVID monoclonal antibody, and the extent to which such potential risks and benefits are unknown.  3. Information on available alternative treatments and the risks and benefits of those alternatives, including clinical trials.  4. Patients treated with COVID monoclonal antibody should continue to self-isolate and use infection control measures (e.g., wear mask, isolate, social distance, avoid sharing personal items, clean and disinfect "high touch" surfaces, and frequent handwashing) according to CDC guidelines.   5. The patient or parent/caregiver has the option to accept or refuse COVID monoclonal antibody treatment.  After  reviewing this information with the patient, the patient has agreed to receive one of the available covid 19 monoclonal antibodies and will be provided an appropriate fact sheet prior to infusion.   Mauricio Po, FNP 10/06/2020 3:57 PM

## 2020-10-06 NOTE — Telephone Encounter (Signed)
Called to Discuss with patient about Covid symptoms and the use of the monoclonal antibody infusion for those with mild to moderate Covid symptoms and at a high risk of hospitalization.     Pt appears to qualify for this infusion due to co-morbid conditions and/or a member of an at-risk group in accordance with the FDA Emergency Use Authorization.   Mr. Catalfamo tested positive for COVID-19 on 10/05/20. Symptoms started on 10/04/20 with chills and continues to experience fever, chills, body aches, fatigue, congestion and cough. Not vaccinated. Qualifying risk factors include BMI >25 (34), and obstructive sleep apnea.   I spoke with Mr. Scow regarding the risks, benefits, potential financial costs associated with treatment and he wishes to continue with treatment.     Hello Leevi L Reisig,   We have contacted you because you have recently been diagnosed with COVID-19 with active symptoms of less than 10 days in duration and may benefit from a new treatment for mild to moderate disease. This treatment has been shown to reduce the risk of hospitalization and decrease the risk for severe symptoms related to COVID-19 in a select group of patients with risk factors / medical conditions we believe put people at a higher risk for this infection.    The Food and Drug Administration (FDA) has given emergency use of a new medication to treat those with mild to moderate symptoms who have risk factors that have been identified for severe symptoms related to COVID-19. This new treatment is a monoclonal antibody - the way it works its that it attaches like a magnet to the SARS-CoV2 virus (the virus that causes COVID-19) and stops it from infecting more cells in your body. It does not kill the virus but it prevents it from spreading throughout your body with the hope that it will decrease your symptoms after it is given.    This new medication is an intravenous (IV) infusion that is given over one half hour in our  outpatient infusion clinic here in [place]. We will require you to stay roughly 60 minutes after the infusion to ensure you are tolerating it and watch for any allergic reaction to the medication. More information will also be given to you at the time of your appointment. There are three  different medications. The medications are called Bamlanivimab & Etesevimab (by Waynetta Sandy) or [place] (by [employer]) or Sotrovimab.   The side effects that have been seen with this treatment:  2-4% of recipients experience nausea, vomiting, diarrhea, dizziness, headaches, itching, worsening fevers or chills for ~24 hours.  There have been no serious infusion related reactions  Of the over 3000 patients who received the infusion one did have an allergic response that resolved once the infusion was stopped - this is why we monitor all of our patients closely for an hour after the infusion.   The covid 19 vaccine (including boosters) must be delayed at least 90 days after receiving this infusion.   The medication itself is free, but your insurance will be charged an infusion fee. The amount you may owe later varies from insurance to insurance. If you do not have insurance, we can put you in touch with our billing department. The CMS codes are:  [phone] or [phone] or [phone]   You have been scheduled to receive the monoclonal antibody therapy at [place]: 10/09/20 at 10:30 am   If you have been tested outside of a [facility] - you MUST bring a copy  of your positive test with you the morning of your appointment. You may take a photo of this and upload to your MyChart portal or have the testing facility fax the result to [phone]    The address for the infusion clinic site is:  --GPS address is [place] - the parking is located near [employer] building where you will see  COVID19 Infusion feather banner marking the entrance to parking.   (see photos below)             --Enter into the 2nd entrance where the "wave, flag banner" is at the road. Turn into this 2nd entrance and immediately turn left to park in 1 of the 5 parking spots.   --Please stay in your car and call the desk for assistance inside [phone].   --Average time in department is roughly 90 minutes to 2 hours for monoclonal treatment - this includes preparation of the medication, IV start and the required 1 hour monitoring after the infusion.    Should you develop worsening shortness of breath, chest pain or severe breathing problems please do not wait for this appointment and go to the Emergency room for evaluation and treatment. You will undergo another oxygen screen before your infusion to ensure this is the best treatment option for you. There is a chance that the best decision may be to send you to the Emergency Room for evaluation at the time of your appointment.   The day of your visit you should: Marland Kitchen Get plenty of rest the night before and drink plenty of water . Eat a light meal/snack before coming and take your medications as prescribed  . Wear warm, comfortable clothes with a shirt that can roll-up over the elbow (will need IV start).  . Wear a mask  . Consider bringing some activity to help pass the time   The medication itself is free, paid for by government COVID funding relief, and many commercial insurers are waiving bills related to COVID treatment, however some have sent bills to patients later for the administration fees related to the medication.  Please contact your insurance agent to discuss prior to your appointment if you would like further details about billing specific to your policy.    Billing codes are: G2563 or M0245 or [place], NP 10/06/2020 3:56 PM

## 2020-10-09 ENCOUNTER — Ambulatory Visit (HOSPITAL_COMMUNITY)
Admission: RE | Admit: 2020-10-09 | Discharge: 2020-10-09 | Disposition: A | Discharge status: Home / Self Care | Payer: BC Managed Care – PPO | Source: Ambulatory Visit | Attending: Pulmonary Disease | Admitting: Pulmonary Disease

## 2020-10-09 ENCOUNTER — Other Ambulatory Visit (HOSPITAL_COMMUNITY): Payer: Self-pay

## 2020-10-09 DIAGNOSIS — G4733 Obstructive sleep apnea (adult) (pediatric): Secondary | ICD-10-CM | POA: Diagnosis not present

## 2020-10-09 DIAGNOSIS — U071 COVID-19: Secondary | ICD-10-CM | POA: Diagnosis not present

## 2020-10-09 DIAGNOSIS — Z609 Problem related to social environment, unspecified: Secondary | ICD-10-CM | POA: Diagnosis not present

## 2020-10-09 MED ORDER — ALBUTEROL SULFATE HFA 108 (90 BASE) MCG/ACT IN AERS: 2.0000 | INHALATION_SPRAY | Freq: Once | RESPIRATORY_TRACT | Status: DC | PRN

## 2020-10-09 MED ORDER — DIPHENHYDRAMINE HCL 50 MG/ML IJ SOLN: 50.0000 mg | Freq: Once | INTRAMUSCULAR | Status: DC | PRN

## 2020-10-09 MED ORDER — EPINEPHRINE 0.3 MG/0.3ML IJ SOAJ: 0.3000 mg | Freq: Once | INTRAMUSCULAR | Status: DC | PRN

## 2020-10-09 MED ORDER — METHYLPREDNISOLONE SODIUM SUCC 125 MG IJ SOLR: 125.0000 mg | Freq: Once | INTRAMUSCULAR | Status: DC | PRN

## 2020-10-09 MED ORDER — SODIUM CHLORIDE 0.9 % IV SOLN: Freq: Once | INTRAVENOUS | Status: AC

## 2020-10-09 MED ORDER — FAMOTIDINE IN NACL 20-0.9 MG/50ML-% IV SOLN: 20.0000 mg | Freq: Once | INTRAVENOUS | Status: DC | PRN

## 2020-10-09 MED ORDER — SODIUM CHLORIDE 0.9 % IV SOLN: INTRAVENOUS | Status: DC | PRN

## 2020-10-09 NOTE — Discharge Instructions (Signed)
10 Things You Can Do to Manage Your COVID-19 Symptoms at Home If you have possible or confirmed COVID-19: 1. Stay home from work and school. And stay away from other public places. If you must go out, avoid using any kind of public transportation, ridesharing, or taxis. 2. Monitor your symptoms carefully. If your symptoms get worse, call your healthcare provider immediately. 3. Get rest and stay hydrated. 4. If you have a medical appointment, call the healthcare provider ahead of time and tell them that you have or may have COVID-19. 5. For medical emergencies, call 911 and notify the dispatch personnel that you have or may have COVID-19. 6. Cover your cough and sneezes with a tissue or use the inside of your elbow. 7. Wash your hands often with soap and water for at least 20 seconds or clean your hands with an alcohol-based hand sanitizer that contains at least 60% alcohol. 8. As much as possible, stay in a specific room and away from other people in your home. Also, you should use a separate bathroom, if available. If you need to be around other people in or outside of the home, wear a mask. 9. Avoid sharing personal items with other people in your household, like dishes, towels, and bedding. 10. Clean all surfaces that are touched often, like counters, tabletops, and doorknobs. Use household cleaning sprays or wipes according to the label instructions. cdc.gov/coronavirus 04/28/2019 This information is not intended to replace advice given to you by your health care provider. Make sure you discuss any questions you have with your health care provider. Document Revised: 09/30/2019 Document Reviewed: 09/30/2019 Elsevier Patient Education  2020 Elsevier Inc. What types of side effects do monoclonal antibody drugs cause?  Common side effects  In general, the more common side effects caused by monoclonal antibody drugs include: . Allergic reactions, such as hives or itching . Flu-like signs and  symptoms, including chills, fatigue, fever, and muscle aches and pains . Nausea, vomiting . Diarrhea . Skin rashes . Low blood pressure   The CDC is recommending patients who receive monoclonal antibody treatments wait at least 90 days before being vaccinated.  Currently, there are no data on the safety and efficacy of mRNA COVID-19 vaccines in persons who received monoclonal antibodies or convalescent plasma as part of COVID-19 treatment. Based on the estimated half-life of such therapies as well as evidence suggesting that reinfection is uncommon in the 90 days after initial infection, vaccination should be deferred for at least 90 days, as a precautionary measure until additional information becomes available, to avoid interference of the antibody treatment with vaccine-induced immune responses. If you have any questions or concerns after the infusion please call the Advanced Practice Provider on call at [phone]. This number is ONLY intended for your use regarding questions or concerns about the infusion post-treatment side-effects.  Please do not provide this number to others for use. For return to work notes please contact your primary care provider.   If someone you know is interested in receiving treatment please have them call the COVID hotline at [phone].   

## 2020-10-09 NOTE — Progress Notes (Signed)
  Diagnosis: COVID-19  Physician: Dr. Joya Gaskins  Procedure: Covid Infusion Clinic Med: bamlanivimab\etesevimab infusion - Provided patient with bamlanimivab\etesevimab fact sheet for patients, parents and caregivers prior to infusion.  Complications: No immediate complications noted.  Discharge: Discharged home   Shawn [name] 10/09/2020

## 2020-10-09 NOTE — Progress Notes (Signed)
Patient reviewed Fact Sheet for Patients, Parents, and Caregivers for Emergency Use Authorization (EUA) of Sotrovimab for the Treatment of Coronavirus. Patient also reviewed and is agreeable to the estimated cost of treatment. Patient is agreeable to proceed.   

## 2020-10-14 DIAGNOSIS — Z20828 Contact with and (suspected) exposure to other viral communicable diseases: Secondary | ICD-10-CM | POA: Diagnosis not present

## 2020-11-27 DIAGNOSIS — Z125 Encounter for screening for malignant neoplasm of prostate: Secondary | ICD-10-CM | POA: Diagnosis not present

## 2020-11-27 DIAGNOSIS — Z0000 Encounter for general adult medical examination without abnormal findings: Secondary | ICD-10-CM | POA: Diagnosis not present

## 2020-11-27 DIAGNOSIS — Z131 Encounter for screening for diabetes mellitus: Secondary | ICD-10-CM | POA: Diagnosis not present

## 2020-11-27 DIAGNOSIS — Z13220 Encounter for screening for lipoid disorders: Secondary | ICD-10-CM | POA: Diagnosis not present

## 2020-11-27 DIAGNOSIS — E669 Obesity, unspecified: Secondary | ICD-10-CM | POA: Diagnosis not present

## 2020-12-11 DIAGNOSIS — M13842 Other specified arthritis, left hand: Secondary | ICD-10-CM | POA: Diagnosis not present

## 2021-03-15 DIAGNOSIS — J329 Chronic sinusitis, unspecified: Secondary | ICD-10-CM | POA: Diagnosis not present

## 2021-03-15 DIAGNOSIS — J40 Bronchitis, not specified as acute or chronic: Secondary | ICD-10-CM | POA: Diagnosis not present

## 2021-03-15 DIAGNOSIS — Z20828 Contact with and (suspected) exposure to other viral communicable diseases: Secondary | ICD-10-CM | POA: Diagnosis not present

## 2021-04-02 DIAGNOSIS — R0989 Other specified symptoms and signs involving the circulatory and respiratory systems: Secondary | ICD-10-CM | POA: Diagnosis not present

## 2021-04-02 DIAGNOSIS — H6121 Impacted cerumen, right ear: Secondary | ICD-10-CM | POA: Diagnosis not present

## 2021-04-18 DIAGNOSIS — D2239 Melanocytic nevi of other parts of face: Secondary | ICD-10-CM | POA: Diagnosis not present

## 2021-04-18 DIAGNOSIS — L820 Inflamed seborrheic keratosis: Secondary | ICD-10-CM | POA: Diagnosis not present

## 2021-04-18 DIAGNOSIS — D225 Melanocytic nevi of trunk: Secondary | ICD-10-CM | POA: Diagnosis not present

## 2021-04-18 DIAGNOSIS — D485 Neoplasm of uncertain behavior of skin: Secondary | ICD-10-CM | POA: Diagnosis not present

## 2021-05-16 ENCOUNTER — Ambulatory Visit: Payer: BC Managed Care – PPO | Admitting: Dermatology

## 2021-07-27 ENCOUNTER — Encounter: Payer: Self-pay | Admitting: Sports Medicine

## 2021-07-27 ENCOUNTER — Ambulatory Visit: Payer: BC Managed Care – PPO | Admitting: Sports Medicine

## 2021-07-27 ENCOUNTER — Other Ambulatory Visit: Payer: Self-pay

## 2021-07-27 DIAGNOSIS — M79671 Pain in right foot: Secondary | ICD-10-CM | POA: Diagnosis not present

## 2021-07-27 DIAGNOSIS — M79672 Pain in left foot: Secondary | ICD-10-CM

## 2021-07-27 DIAGNOSIS — M216X2 Other acquired deformities of left foot: Secondary | ICD-10-CM

## 2021-07-27 DIAGNOSIS — M722 Plantar fascial fibromatosis: Secondary | ICD-10-CM

## 2021-07-27 DIAGNOSIS — M7661 Achilles tendinitis, right leg: Secondary | ICD-10-CM

## 2021-07-27 DIAGNOSIS — M7662 Achilles tendinitis, left leg: Secondary | ICD-10-CM

## 2021-07-27 DIAGNOSIS — M216X1 Other acquired deformities of right foot: Secondary | ICD-10-CM | POA: Diagnosis not present

## 2021-07-27 MED ORDER — PREDNISONE 10 MG (21) PO TBPK: ORAL_TABLET | ORAL | 0 refills | Status: DC

## 2021-07-27 MED ORDER — MELOXICAM 15 MG PO TABS: 15.0000 mg | ORAL_TABLET | Freq: Every day | ORAL | 0 refills | Status: AC

## 2021-07-27 NOTE — Progress Notes (Signed)
Subjective: Shawn [name] is a 65 y.o. male patient who presents to office for evaluation of right and left heel pain.  Patient reports that his heels are hurting the right seems to be worse at the back of the heel and states that the injections he received in the past and the bottom did not help states that he still has some pain in the bottom but lately he has been doing a lot more walking and standing at work and the back of the heels are hurting more.  Patient reports that when he rest he does not have much pain however when he is very active the pain worsens.  Patient has been taking ibuprofen and has changed to Hoka shoes and the Hoka shoes have helped.  Patient denies any other pedal complaints at this time.  Patient Active Problem List   Diagnosis Date Noted   Chronic throat clearing 04/02/2021   Dizziness 07/18/2020   Tinnitus 05/09/2016   Hyperglycemia 02/12/2016   Allergic rhinitis 02/11/2016   GERD (gastroesophageal reflux disease) 02/11/2016   Hypotestosteronism 02/11/2016   Obstructive sleep apnea 02/11/2016   Wellness examination 02/11/2016   Asymmetrical hearing loss of left ear 12/20/2015    Current Outpatient Medications on File Prior to Visit  Medication Sig Dispense Refill   Azelastine-Fluticasone 137-50 MCG/ACT SUSP Place 1 spray into both nostrils 2 (two) times daily.     cefdinir (OMNICEF) 300 MG capsule Take 300 mg by mouth 2 (two) times daily.     levofloxacin (LEVAQUIN) 750 MG tablet Take 750 mg by mouth daily.     magnesium oxide (MAG-OX) 400 MG tablet Take by mouth.     meclizine (ANTIVERT) 25 MG tablet Take 1 tablet by mouth 2 (two) times daily.     pantoprazole (PROTONIX) 40 MG tablet Take 40 mg by mouth 2 (two) times daily.     No current facility-administered medications on file prior to visit.    No Known Allergies  Objective:  General: Alert and oriented x3 in no acute distress  Dermatology: No open lesions bilateral lower extremities, no webspace  macerations, no ecchymosis bilateral, all nails x 10 are well manicured.  Vascular: Dorsalis Pedis and Posterior Tibial pedal pulses 1/4, Capillary Fill Time 3 seconds, + pedal hair growth bilateral, no edema bilateral lower extremities, Temperature gradient within normal limits.  Neurology: Johney Maine sensation intact via light touch bilateral.  Musculoskeletal: There is mild pain to palpation to the plantar fascial insertion at the medial aspect of the right and left heel.  Mild to moderate tenderness with palpation at insertion of the Achilles on Right>Left, there is calcaneal exostosis with mild soft tissue swelling present and decreased ankle rom with knee extending  vs flexed resembling gastroc equnius bilateral, The achilles tendon feels intact with no nodularity or palpable dell, Thompson sign negative, Subtalar and midtarsal joint range of motion is within normal limits, there is no 1st ray hypermobility or forefoot deformity noted bilateral.   Gait: Antalgic gait with increased heel off right>left.  Assessment and Plan: Problem List Items Addressed This Visit   None Visit Diagnoses     Plantar fasciitis, bilateral    -  Primary   Achilles tendinitis of both lower extremities       Heel pain, bilateral       Acquired equinus deformity of both feet           -Complete examination performed -Discussed treatment options for likely Achilles tendinitis greater than Planter fasciitis bilateral  with equinus -Rx prednisone Dosepak for patient to take first as directed -Rx Meloxicam to take after steroid is completed -Dispensed heel lifts -Dispensed night splint to assist with gentle stretching as directed bilateral -Advised patient to ice 20 minutes every afternoon -No improvement will consider repeat x-ray/PT/EPAT -Patient to return to office 3 to 4 weeks or sooner if condition worsens.  Landis Martins, DPM

## 2021-08-08 ENCOUNTER — Ambulatory Visit: Payer: BC Managed Care – PPO | Admitting: Sports Medicine

## 2021-08-08 ENCOUNTER — Ambulatory Visit (INDEPENDENT_AMBULATORY_CARE_PROVIDER_SITE_OTHER): Payer: BC Managed Care – PPO

## 2021-08-08 ENCOUNTER — Encounter: Payer: Self-pay | Admitting: Sports Medicine

## 2021-08-08 ENCOUNTER — Other Ambulatory Visit: Payer: Self-pay

## 2021-08-08 DIAGNOSIS — M67479 Ganglion, unspecified ankle and foot: Secondary | ICD-10-CM | POA: Diagnosis not present

## 2021-08-08 DIAGNOSIS — M25571 Pain in right ankle and joints of right foot: Secondary | ICD-10-CM | POA: Diagnosis not present

## 2021-08-08 DIAGNOSIS — M6740 Ganglion, unspecified site: Secondary | ICD-10-CM

## 2021-08-08 NOTE — Progress Notes (Signed)
Subjective: Shawn [name] is a 65 y.o. male patient who presents to office for evaluation of right ankle pain. Patient complains of soreness to the right medial ankle reports that he had a knot pop up on the area on last night and reports that he became concerned and wanted to have it checked.  Patient reports that 1 to 2 weeks ago he does remember bumping the right ankle but at that time did not experience any pain or swelling and states that the knot is new.  Patient reports for his other pain on the right heel it is slowly doing better night splint prednisone has helped that and he is taking his meloxicam of which she has taken for the last week.  Patient denies any other pedal complaints currently at this time.  Patient Active Problem List   Diagnosis Date Noted   Chronic throat clearing 04/02/2021   Dizziness 07/18/2020   Tinnitus 05/09/2016   Hyperglycemia 02/12/2016   Allergic rhinitis 02/11/2016   GERD (gastroesophageal reflux disease) 02/11/2016   Hypotestosteronism 02/11/2016   Obstructive sleep apnea 02/11/2016   Wellness examination 02/11/2016   Asymmetrical hearing loss of left ear 12/20/2015    Current Outpatient Medications on File Prior to Visit  Medication Sig Dispense Refill   Azelastine-Fluticasone 137-50 MCG/ACT SUSP Place 1 spray into both nostrils 2 (two) times daily.     cefdinir (OMNICEF) 300 MG capsule Take 300 mg by mouth 2 (two) times daily.     levofloxacin (LEVAQUIN) 750 MG tablet Take 750 mg by mouth daily.     magnesium oxide (MAG-OX) 400 MG tablet Take by mouth.     meclizine (ANTIVERT) 25 MG tablet Take 1 tablet by mouth 2 (two) times daily.     meloxicam (MOBIC) 15 MG tablet Take 1 tablet (15 mg total) by mouth daily. 30 tablet 0   pantoprazole (PROTONIX) 40 MG tablet Take 40 mg by mouth 2 (two) times daily.     No current facility-administered medications on file prior to visit.    No Known Allergies  Objective:  General: Alert and oriented x3 in  no acute distress  Dermatology: No open lesions bilateral lower extremities, no webspace macerations, no ecchymosis bilateral, all nails x 10 are well manicured.  Raised soft tissue mass measuring less than 1 cm consistent with cyst with very minimal edema, no redness, no warmth, no acute signs of infection.  Vascular: Dorsalis Pedis and Posterior Tibial pedal pulses palpable, Capillary Fill Time 3 seconds,(+) pedal hair growth bilateral, no edema bilateral lower extremities, Temperature gradient within normal limits.  Neurology: Johney Maine sensation intact via light touch bilateral.  Musculoskeletal: Mild tenderness with palpation at right medial ankle at soft tissue mass likely consistent with cyst.  Decreased pain to palpation to plantar fascial insertion and Achilles tendon insertion on the right.    Xrays  Right  ankle   Impression: No acute osseous findings at the area of soft tissue mass.  Assessment and Plan: Problem List Items Addressed This Visit   None Visit Diagnoses     Ganglion cyst    -  Primary   Relevant Orders   DG Ankle Complete Right   Acute right ankle pain          -Complete examination performed -Xrays reviewed -Discussed treatment options for likely cyst -Patient is agreeable for [place] to attempt an aspiration of the cyst of the right medial ankle.  After aseptic prep local anesthetic utilizing 3 cc of lidocaine was administered  in a local field block fashion at the area of the cyst at the right medial ankle.  Anesthesia was then confirmed and using a 18-gauge needle puncture aspiration was performed evacuating half cc of clear to yellow fluid consistent with cyst.  Hemostasis was achieved with manual pressure.  The area was dressed with antibiotic cream and a compressive dressing and advised patient to do the same for the next few days until the puncture wound has healed from the aspiration site.  Patient tolerated the procedure well.  No complications. -Advised patient  rest ice elevation to assist with pain and edema control at the puncture site -Advised patient to continue with meloxicam and continue with night splint and all other treatment that he has been doing prior for his previous history of heel pain -Patient to return to office as scheduled for aspiration site check and for follow-up on heel pain or sooner if problems or issues arise.  Landis Martins, DPM

## 2021-08-24 ENCOUNTER — Ambulatory Visit: Payer: BC Managed Care – PPO | Admitting: Sports Medicine

## 2021-08-29 ENCOUNTER — Ambulatory Visit: Payer: BC Managed Care – PPO | Admitting: Sports Medicine

## 2021-11-01 DIAGNOSIS — J40 Bronchitis, not specified as acute or chronic: Secondary | ICD-10-CM | POA: Diagnosis not present

## 2021-11-01 DIAGNOSIS — J329 Chronic sinusitis, unspecified: Secondary | ICD-10-CM | POA: Diagnosis not present

## 2021-11-01 DIAGNOSIS — Z20828 Contact with and (suspected) exposure to other viral communicable diseases: Secondary | ICD-10-CM | POA: Diagnosis not present

## 2022-03-11 DIAGNOSIS — M25642 Stiffness of left hand, not elsewhere classified: Secondary | ICD-10-CM | POA: Diagnosis not present

## 2022-03-27 DIAGNOSIS — Z131 Encounter for screening for diabetes mellitus: Secondary | ICD-10-CM | POA: Diagnosis not present

## 2022-03-27 DIAGNOSIS — Z0000 Encounter for general adult medical examination without abnormal findings: Secondary | ICD-10-CM | POA: Diagnosis not present

## 2022-03-27 DIAGNOSIS — Z13220 Encounter for screening for lipoid disorders: Secondary | ICD-10-CM | POA: Diagnosis not present

## 2022-03-27 DIAGNOSIS — Z125 Encounter for screening for malignant neoplasm of prostate: Secondary | ICD-10-CM | POA: Diagnosis not present

## 2022-04-03 DIAGNOSIS — Z1331 Encounter for screening for depression: Secondary | ICD-10-CM | POA: Diagnosis not present

## 2022-04-03 DIAGNOSIS — Z0000 Encounter for general adult medical examination without abnormal findings: Secondary | ICD-10-CM | POA: Diagnosis not present

## 2022-04-03 DIAGNOSIS — Z6833 Body mass index (BMI) 33.0-33.9, adult: Secondary | ICD-10-CM | POA: Diagnosis not present

## 2022-04-03 DIAGNOSIS — E669 Obesity, unspecified: Secondary | ICD-10-CM | POA: Diagnosis not present

## 2022-06-05 DIAGNOSIS — H40013 Open angle with borderline findings, low risk, bilateral: Secondary | ICD-10-CM | POA: Diagnosis not present

## 2022-08-19 DIAGNOSIS — J329 Chronic sinusitis, unspecified: Secondary | ICD-10-CM | POA: Diagnosis not present

## 2022-08-19 DIAGNOSIS — J40 Bronchitis, not specified as acute or chronic: Secondary | ICD-10-CM | POA: Diagnosis not present

## 2022-09-04 DIAGNOSIS — L814 Other melanin hyperpigmentation: Secondary | ICD-10-CM | POA: Diagnosis not present

## 2022-09-04 DIAGNOSIS — L820 Inflamed seborrheic keratosis: Secondary | ICD-10-CM | POA: Diagnosis not present

## 2022-09-04 DIAGNOSIS — D225 Melanocytic nevi of trunk: Secondary | ICD-10-CM | POA: Diagnosis not present

## 2022-10-18 DIAGNOSIS — J40 Bronchitis, not specified as acute or chronic: Secondary | ICD-10-CM | POA: Diagnosis not present

## 2022-10-18 DIAGNOSIS — J329 Chronic sinusitis, unspecified: Secondary | ICD-10-CM | POA: Diagnosis not present

## 2022-11-22 DIAGNOSIS — J40 Bronchitis, not specified as acute or chronic: Secondary | ICD-10-CM | POA: Diagnosis not present

## 2022-11-22 DIAGNOSIS — J329 Chronic sinusitis, unspecified: Secondary | ICD-10-CM | POA: Diagnosis not present

## 2022-12-19 DIAGNOSIS — H2513 Age-related nuclear cataract, bilateral: Secondary | ICD-10-CM | POA: Diagnosis not present

## 2022-12-26 DIAGNOSIS — L820 Inflamed seborrheic keratosis: Secondary | ICD-10-CM | POA: Diagnosis not present

## 2023-01-22 DIAGNOSIS — J40 Bronchitis, not specified as acute or chronic: Secondary | ICD-10-CM | POA: Diagnosis not present

## 2023-01-22 DIAGNOSIS — Z6834 Body mass index (BMI) 34.0-34.9, adult: Secondary | ICD-10-CM | POA: Diagnosis not present

## 2023-01-22 DIAGNOSIS — J329 Chronic sinusitis, unspecified: Secondary | ICD-10-CM | POA: Diagnosis not present

## 2023-01-22 DIAGNOSIS — K2960 Other gastritis without bleeding: Secondary | ICD-10-CM | POA: Diagnosis not present

## 2023-01-22 DIAGNOSIS — E669 Obesity, unspecified: Secondary | ICD-10-CM | POA: Diagnosis not present

## 2023-01-22 DIAGNOSIS — H9012 Conductive hearing loss, unilateral, left ear, with unrestricted hearing on the contralateral side: Secondary | ICD-10-CM | POA: Diagnosis not present

## 2023-02-03 DIAGNOSIS — H9012 Conductive hearing loss, unilateral, left ear, with unrestricted hearing on the contralateral side: Secondary | ICD-10-CM | POA: Diagnosis not present

## 2023-03-12 DIAGNOSIS — M79671 Pain in right foot: Secondary | ICD-10-CM | POA: Diagnosis not present

## 2023-03-12 DIAGNOSIS — Z6834 Body mass index (BMI) 34.0-34.9, adult: Secondary | ICD-10-CM | POA: Diagnosis not present

## 2023-03-20 DIAGNOSIS — M25571 Pain in right ankle and joints of right foot: Secondary | ICD-10-CM | POA: Diagnosis not present

## 2023-04-04 DIAGNOSIS — S93401A Sprain of unspecified ligament of right ankle, initial encounter: Secondary | ICD-10-CM | POA: Diagnosis not present

## 2023-04-07 DIAGNOSIS — Z125 Encounter for screening for malignant neoplasm of prostate: Secondary | ICD-10-CM | POA: Diagnosis not present

## 2023-04-07 DIAGNOSIS — Z0000 Encounter for general adult medical examination without abnormal findings: Secondary | ICD-10-CM | POA: Diagnosis not present

## 2023-04-08 DIAGNOSIS — E669 Obesity, unspecified: Secondary | ICD-10-CM | POA: Diagnosis not present

## 2023-04-08 DIAGNOSIS — Z0000 Encounter for general adult medical examination without abnormal findings: Secondary | ICD-10-CM | POA: Diagnosis not present

## 2023-05-05 DIAGNOSIS — L814 Other melanin hyperpigmentation: Secondary | ICD-10-CM | POA: Diagnosis not present

## 2023-05-05 DIAGNOSIS — L820 Inflamed seborrheic keratosis: Secondary | ICD-10-CM | POA: Diagnosis not present

## 2023-05-05 DIAGNOSIS — L578 Other skin changes due to chronic exposure to nonionizing radiation: Secondary | ICD-10-CM | POA: Diagnosis not present

## 2023-05-05 DIAGNOSIS — D225 Melanocytic nevi of trunk: Secondary | ICD-10-CM | POA: Diagnosis not present

## 2023-05-12 DIAGNOSIS — J329 Chronic sinusitis, unspecified: Secondary | ICD-10-CM | POA: Diagnosis not present

## 2023-05-12 DIAGNOSIS — J40 Bronchitis, not specified as acute or chronic: Secondary | ICD-10-CM | POA: Diagnosis not present

## 2023-05-12 DIAGNOSIS — Z6833 Body mass index (BMI) 33.0-33.9, adult: Secondary | ICD-10-CM | POA: Diagnosis not present

## 2023-05-12 DIAGNOSIS — R030 Elevated blood-pressure reading, without diagnosis of hypertension: Secondary | ICD-10-CM | POA: Diagnosis not present

## 2023-07-29 DIAGNOSIS — J40 Bronchitis, not specified as acute or chronic: Secondary | ICD-10-CM | POA: Diagnosis not present

## 2023-07-29 DIAGNOSIS — J329 Chronic sinusitis, unspecified: Secondary | ICD-10-CM | POA: Diagnosis not present

## 2023-09-17 DIAGNOSIS — M722 Plantar fascial fibromatosis: Secondary | ICD-10-CM | POA: Diagnosis not present

## 2023-10-01 DIAGNOSIS — M722 Plantar fascial fibromatosis: Secondary | ICD-10-CM | POA: Diagnosis not present

## 2023-10-13 DIAGNOSIS — M79631 Pain in right forearm: Secondary | ICD-10-CM | POA: Diagnosis not present

## 2023-10-13 DIAGNOSIS — M13832 Other specified arthritis, left wrist: Secondary | ICD-10-CM | POA: Diagnosis not present

## 2023-10-13 DIAGNOSIS — R2231 Localized swelling, mass and lump, right upper limb: Secondary | ICD-10-CM | POA: Diagnosis not present

## 2023-10-13 DIAGNOSIS — M13842 Other specified arthritis, left hand: Secondary | ICD-10-CM | POA: Diagnosis not present

## 2023-10-13 DIAGNOSIS — Z4789 Encounter for other orthopedic aftercare: Secondary | ICD-10-CM | POA: Diagnosis not present

## 2023-11-03 DIAGNOSIS — Z6835 Body mass index (BMI) 35.0-35.9, adult: Secondary | ICD-10-CM | POA: Diagnosis not present

## 2023-11-03 DIAGNOSIS — J329 Chronic sinusitis, unspecified: Secondary | ICD-10-CM | POA: Diagnosis not present

## 2023-11-03 DIAGNOSIS — J40 Bronchitis, not specified as acute or chronic: Secondary | ICD-10-CM | POA: Diagnosis not present

## 2023-11-04 DIAGNOSIS — M79631 Pain in right forearm: Secondary | ICD-10-CM | POA: Diagnosis not present

## 2023-11-05 DIAGNOSIS — M722 Plantar fascial fibromatosis: Secondary | ICD-10-CM | POA: Diagnosis not present

## 2023-11-12 DIAGNOSIS — M79631 Pain in right forearm: Secondary | ICD-10-CM | POA: Diagnosis not present

## 2023-11-12 DIAGNOSIS — Z4789 Encounter for other orthopedic aftercare: Secondary | ICD-10-CM | POA: Diagnosis not present

## 2023-11-17 DIAGNOSIS — M722 Plantar fascial fibromatosis: Secondary | ICD-10-CM | POA: Diagnosis not present

## 2023-11-25 DIAGNOSIS — R0989 Other specified symptoms and signs involving the circulatory and respiratory systems: Secondary | ICD-10-CM | POA: Diagnosis not present

## 2023-11-25 DIAGNOSIS — J329 Chronic sinusitis, unspecified: Secondary | ICD-10-CM | POA: Diagnosis not present

## 2023-11-25 DIAGNOSIS — J40 Bronchitis, not specified as acute or chronic: Secondary | ICD-10-CM | POA: Diagnosis not present

## 2023-12-31 DIAGNOSIS — H81399 Other peripheral vertigo, unspecified ear: Secondary | ICD-10-CM | POA: Diagnosis not present

## 2024-01-14 DIAGNOSIS — H81399 Other peripheral vertigo, unspecified ear: Secondary | ICD-10-CM | POA: Diagnosis not present

## 2024-01-21 DIAGNOSIS — H81399 Other peripheral vertigo, unspecified ear: Secondary | ICD-10-CM | POA: Diagnosis not present

## 2024-01-28 DIAGNOSIS — H81399 Other peripheral vertigo, unspecified ear: Secondary | ICD-10-CM | POA: Diagnosis not present

## 2024-02-11 DIAGNOSIS — H81399 Other peripheral vertigo, unspecified ear: Secondary | ICD-10-CM | POA: Diagnosis not present

## 2024-02-17 DIAGNOSIS — H25813 Combined forms of age-related cataract, bilateral: Secondary | ICD-10-CM | POA: Diagnosis not present

## 2024-02-17 DIAGNOSIS — H43821 Vitreomacular adhesion, right eye: Secondary | ICD-10-CM | POA: Diagnosis not present

## 2024-03-09 DIAGNOSIS — E669 Obesity, unspecified: Secondary | ICD-10-CM | POA: Diagnosis not present

## 2024-03-09 DIAGNOSIS — Z125 Encounter for screening for malignant neoplasm of prostate: Secondary | ICD-10-CM | POA: Diagnosis not present

## 2024-03-09 DIAGNOSIS — Z6834 Body mass index (BMI) 34.0-34.9, adult: Secondary | ICD-10-CM | POA: Diagnosis not present

## 2024-03-09 DIAGNOSIS — Z0000 Encounter for general adult medical examination without abnormal findings: Secondary | ICD-10-CM | POA: Diagnosis not present

## 2024-03-09 DIAGNOSIS — Z131 Encounter for screening for diabetes mellitus: Secondary | ICD-10-CM | POA: Diagnosis not present

## 2024-03-09 DIAGNOSIS — J209 Acute bronchitis, unspecified: Secondary | ICD-10-CM | POA: Diagnosis not present

## 2024-03-09 DIAGNOSIS — Z1211 Encounter for screening for malignant neoplasm of colon: Secondary | ICD-10-CM | POA: Diagnosis not present

## 2024-03-09 DIAGNOSIS — E785 Hyperlipidemia, unspecified: Secondary | ICD-10-CM | POA: Diagnosis not present

## 2024-03-09 DIAGNOSIS — K2960 Other gastritis without bleeding: Secondary | ICD-10-CM | POA: Diagnosis not present

## 2024-03-09 DIAGNOSIS — Z1339 Encounter for screening examination for other mental health and behavioral disorders: Secondary | ICD-10-CM | POA: Diagnosis not present

## 2024-03-09 DIAGNOSIS — Z1331 Encounter for screening for depression: Secondary | ICD-10-CM | POA: Diagnosis not present

## 2024-03-09 DIAGNOSIS — Z79899 Other long term (current) drug therapy: Secondary | ICD-10-CM | POA: Diagnosis not present

## 2024-03-19 DIAGNOSIS — H34832 Tributary (branch) retinal vein occlusion, left eye, with macular edema: Secondary | ICD-10-CM | POA: Diagnosis not present

## 2024-04-22 DIAGNOSIS — H34832 Tributary (branch) retinal vein occlusion, left eye, with macular edema: Secondary | ICD-10-CM | POA: Diagnosis not present

## 2024-05-03 DIAGNOSIS — D225 Melanocytic nevi of trunk: Secondary | ICD-10-CM | POA: Diagnosis not present

## 2024-05-03 DIAGNOSIS — L578 Other skin changes due to chronic exposure to nonionizing radiation: Secondary | ICD-10-CM | POA: Diagnosis not present

## 2024-05-03 DIAGNOSIS — L814 Other melanin hyperpigmentation: Secondary | ICD-10-CM | POA: Diagnosis not present

## 2024-05-03 DIAGNOSIS — L821 Other seborrheic keratosis: Secondary | ICD-10-CM | POA: Diagnosis not present

## 2024-05-04 DIAGNOSIS — N644 Mastodynia: Secondary | ICD-10-CM | POA: Diagnosis not present

## 2024-05-04 DIAGNOSIS — S29011A Strain of muscle and tendon of front wall of thorax, initial encounter: Secondary | ICD-10-CM | POA: Diagnosis not present

## 2024-05-04 DIAGNOSIS — K2960 Other gastritis without bleeding: Secondary | ICD-10-CM | POA: Diagnosis not present

## 2024-05-27 DIAGNOSIS — H34832 Tributary (branch) retinal vein occlusion, left eye, with macular edema: Secondary | ICD-10-CM | POA: Diagnosis not present

## 2024-07-01 DIAGNOSIS — M25512 Pain in left shoulder: Secondary | ICD-10-CM | POA: Diagnosis not present

## 2024-07-01 DIAGNOSIS — M25511 Pain in right shoulder: Secondary | ICD-10-CM | POA: Diagnosis not present

## 2024-07-29 DIAGNOSIS — H34832 Tributary (branch) retinal vein occlusion, left eye, with macular edema: Secondary | ICD-10-CM | POA: Diagnosis not present

## 2024-08-26 DIAGNOSIS — M25511 Pain in right shoulder: Secondary | ICD-10-CM | POA: Diagnosis not present

## 2024-09-02 DIAGNOSIS — H34832 Tributary (branch) retinal vein occlusion, left eye, with macular edema: Secondary | ICD-10-CM | POA: Diagnosis not present
